# Patient Record
Sex: Female | Born: 1975 | Race: Black or African American | Hispanic: No | Marital: Married | Smoking: Never smoker
Health system: Southern US, Community
[De-identification: ages and names within clinical notes are randomized; demographics above are authoritative.]

## PROBLEM LIST (undated history)

## (undated) DIAGNOSIS — H10409 Unspecified chronic conjunctivitis, unspecified eye: Secondary | ICD-10-CM

## (undated) DIAGNOSIS — Z9109 Other allergy status, other than to drugs and biological substances: Secondary | ICD-10-CM

## (undated) DIAGNOSIS — M1990 Unspecified osteoarthritis, unspecified site: Secondary | ICD-10-CM

## (undated) DIAGNOSIS — Z973 Presence of spectacles and contact lenses: Secondary | ICD-10-CM

## (undated) DIAGNOSIS — H409 Unspecified glaucoma: Secondary | ICD-10-CM

## (undated) DIAGNOSIS — J309 Allergic rhinitis, unspecified: Secondary | ICD-10-CM

## (undated) DIAGNOSIS — N939 Abnormal uterine and vaginal bleeding, unspecified: Secondary | ICD-10-CM

## (undated) DIAGNOSIS — D509 Iron deficiency anemia, unspecified: Secondary | ICD-10-CM

## (undated) DIAGNOSIS — Z8632 Personal history of gestational diabetes: Secondary | ICD-10-CM

## (undated) DIAGNOSIS — D259 Leiomyoma of uterus, unspecified: Secondary | ICD-10-CM

## (undated) HISTORY — PX: NO PAST SURGERIES: SHX2092

---

## 2005-12-03 ENCOUNTER — Ambulatory Visit: Payer: Self-pay | Admitting: Family Medicine

## 2006-02-11 ENCOUNTER — Inpatient Hospital Stay (HOSPITAL_COMMUNITY): Admission: AD | Admit: 2006-02-11 | Discharge: 2006-02-11 | Payer: Self-pay | Admitting: Gynecology

## 2006-02-25 ENCOUNTER — Ambulatory Visit (HOSPITAL_COMMUNITY): Admission: RE | Admit: 2006-02-25 | Discharge: 2006-02-25 | Payer: Self-pay | Admitting: Gynecology

## 2006-04-17 ENCOUNTER — Ambulatory Visit: Payer: Self-pay | Admitting: Obstetrics & Gynecology

## 2006-05-01 ENCOUNTER — Ambulatory Visit (HOSPITAL_COMMUNITY): Admission: RE | Admit: 2006-05-01 | Discharge: 2006-05-01 | Payer: Self-pay | Admitting: Family Medicine

## 2006-05-01 ENCOUNTER — Ambulatory Visit: Payer: Self-pay | Admitting: Obstetrics & Gynecology

## 2006-05-15 ENCOUNTER — Ambulatory Visit: Payer: Self-pay | Admitting: *Deleted

## 2006-05-19 ENCOUNTER — Inpatient Hospital Stay (HOSPITAL_COMMUNITY): Admission: RE | Admit: 2006-05-19 | Discharge: 2006-05-19 | Payer: Self-pay | Admitting: Family Medicine

## 2006-05-20 ENCOUNTER — Ambulatory Visit: Payer: Self-pay | Admitting: Family Medicine

## 2006-05-27 ENCOUNTER — Ambulatory Visit: Payer: Self-pay | Admitting: Family Medicine

## 2006-06-03 ENCOUNTER — Ambulatory Visit: Payer: Self-pay | Admitting: Obstetrics & Gynecology

## 2006-06-17 ENCOUNTER — Ambulatory Visit: Payer: Self-pay | Admitting: Family Medicine

## 2006-07-01 ENCOUNTER — Ambulatory Visit: Payer: Self-pay | Admitting: Family Medicine

## 2006-07-08 ENCOUNTER — Ambulatory Visit: Payer: Self-pay | Admitting: Obstetrics & Gynecology

## 2006-07-11 ENCOUNTER — Ambulatory Visit: Payer: Self-pay | Admitting: Family Medicine

## 2006-07-15 ENCOUNTER — Ambulatory Visit: Payer: Self-pay | Admitting: Obstetrics & Gynecology

## 2006-07-15 ENCOUNTER — Ambulatory Visit (HOSPITAL_COMMUNITY): Admission: RE | Admit: 2006-07-15 | Discharge: 2006-07-15 | Payer: Self-pay | Admitting: Obstetrics and Gynecology

## 2006-07-22 ENCOUNTER — Ambulatory Visit: Payer: Self-pay | Admitting: Gynecology

## 2006-07-23 ENCOUNTER — Inpatient Hospital Stay (HOSPITAL_COMMUNITY): Admission: RE | Admit: 2006-07-23 | Discharge: 2006-07-26 | Payer: Self-pay | Admitting: Gynecology

## 2006-07-23 ENCOUNTER — Ambulatory Visit: Payer: Self-pay | Admitting: Obstetrics and Gynecology

## 2006-08-02 ENCOUNTER — Ambulatory Visit: Payer: Self-pay | Admitting: *Deleted

## 2006-08-02 ENCOUNTER — Inpatient Hospital Stay (HOSPITAL_COMMUNITY): Admission: AD | Admit: 2006-08-02 | Discharge: 2006-08-02 | Payer: Self-pay | Admitting: Obstetrics & Gynecology

## 2007-12-19 ENCOUNTER — Ambulatory Visit: Payer: Self-pay | Admitting: Family Medicine

## 2007-12-22 ENCOUNTER — Ambulatory Visit: Payer: Self-pay | Admitting: *Deleted

## 2007-12-25 ENCOUNTER — Ambulatory Visit: Payer: Self-pay | Admitting: Internal Medicine

## 2008-04-02 ENCOUNTER — Ambulatory Visit: Payer: Self-pay | Admitting: Internal Medicine

## 2008-05-03 ENCOUNTER — Inpatient Hospital Stay (HOSPITAL_COMMUNITY): Admission: AD | Admit: 2008-05-03 | Discharge: 2008-05-04 | Payer: Self-pay | Admitting: Obstetrics & Gynecology

## 2008-05-19 ENCOUNTER — Inpatient Hospital Stay (HOSPITAL_COMMUNITY): Admission: AD | Admit: 2008-05-19 | Discharge: 2008-05-19 | Payer: Self-pay | Admitting: Obstetrics & Gynecology

## 2008-05-23 ENCOUNTER — Inpatient Hospital Stay (HOSPITAL_COMMUNITY): Admission: AD | Admit: 2008-05-23 | Discharge: 2008-05-23 | Payer: Self-pay | Admitting: Obstetrics & Gynecology

## 2008-10-30 ENCOUNTER — Inpatient Hospital Stay (HOSPITAL_COMMUNITY): Admission: AD | Admit: 2008-10-30 | Discharge: 2008-10-30 | Payer: Self-pay | Admitting: Obstetrics and Gynecology

## 2008-11-24 ENCOUNTER — Inpatient Hospital Stay (HOSPITAL_COMMUNITY): Admission: AD | Admit: 2008-11-24 | Discharge: 2008-11-26 | Payer: Self-pay | Admitting: Obstetrics and Gynecology

## 2009-06-06 ENCOUNTER — Ambulatory Visit: Payer: Self-pay | Admitting: Internal Medicine

## 2009-12-19 ENCOUNTER — Emergency Department (HOSPITAL_COMMUNITY): Admission: EM | Admit: 2009-12-19 | Discharge: 2009-12-19 | Payer: Self-pay | Admitting: Emergency Medicine

## 2010-08-27 DIAGNOSIS — Z8632 Personal history of gestational diabetes: Secondary | ICD-10-CM

## 2010-08-27 HISTORY — DX: Personal history of gestational diabetes: Z86.32

## 2010-12-06 LAB — CBC
HCT: 31.5 % — ABNORMAL LOW (ref 36.0–46.0)
Hemoglobin: 10.7 g/dL — ABNORMAL LOW (ref 12.0–15.0)
Platelets: 143 10*3/uL — ABNORMAL LOW (ref 150–400)
RBC: 3.44 MIL/uL — ABNORMAL LOW (ref 3.87–5.11)
RDW: 14.4 % (ref 11.5–15.5)

## 2010-12-07 LAB — CBC: MCHC: 33.8 g/dL (ref 30.0–36.0)

## 2011-01-09 NOTE — Discharge Summary (Signed)
Renee Roy, Renee Roy                  ACCOUNT NO.:  [id number]   MEDICAL RECORD NO.:  [id number]          PATIENT TYPE:  INP   LOCATION:  9104                          FACILITY:  [facility]   PHYSICIAN:  Sherron Monday, MD        DATE OF BIRTH:  [birth year]   DATE OF ADMISSION:  11/24/2008  DATE OF DISCHARGE:  11/26/2008                               DISCHARGE SUMMARY   ADMITTING DIAGNOSIS:  Intrauterine pregnancy at term in early labor,  delivered via spontaneous vaginal delivery.   HISTORY OF PRESENT ILLNESS:  A 35 year old G2, P38 with an EDC of November 27, 2008 admitted in early labor.  She came in for evaluation of  contractions and was then to be 3-4 cm dilated, 80% effaced, and was  admitted.   PAST MEDICAL HISTORY:  Not significant.   PAST SURGICAL HISTORY:  Not significant.   PAST OB/GYN HISTORY:  G1 was a vaginal delivery of a female infant at  term.  She has a history of gestational diabetes.  No abnormal Paps or  sexually transmitted diseases.   MEDICATIONS:  Prenatal vitamins.   ALLERGIES:  No known drug allergies.   SOCIAL HISTORY:  Denies alcohol, tobacco, or drug use.   FAMILY HISTORY:  Negative for the patient.   PRENATAL LABORATORY DATA:  O positive.  Antibody screen negative.  Sickle cell within normal limits.  RPR nonreactive, rubella immune,  hepatitis B surface antigen negative, and HIV negative.  Gonorrhea  negative, chlamydia negative, 1-hour Glucola of 150, 2-hour Glucola of  100, 166, 148, 89, which was within normal limits.  She declined a quad  scan.  She had an ultrasound on August 11, 2008 at 15 plus 2 weeks  confirming the [facility] of November 27, 2008 and ultrasound was performed.  At 19  weeks from the ED revealed pyelectasis.  Repeat ultrasound showed no  pyelectasis, however.  Fluid collection on the anterior wall.  Her  prenatal course was otherwise uncomplicated.   Admitted physical exam, afebrile.  Vital signs stable and benign exam  with fetal heart tones  are reactive without decelerations.  She was  admitted and progressed in labor.  We used Stadol for anesthesia.  She  progressed complete, complete +2, pushed to deliver a viable female infant  with Apgars of 9 and 9 and a weight of 6 pounds 12 ounces with second-  degree laceration.  Postpartum course was relatively uncomplicated.  She  remained afebrile.  Vital signs stable with a hemoglobin decreasing from  12.1-10.7.  She was discharged home on postpartum day #2.  At this time,  she was feeling well, voiding, and tolerating a diet.  Normal lochia and  her pain was well controlled.  She plans to breast feed.  We will  discuss contraception at her postpartum checkup.  Hemoglobin decreased  from 12.1-10.7.  She  is rubella immune and O positive.  She was given routine discharge  instructions and numbers to call if any questions or problems.  She  voices understanding of this and was discharged to home.  She was  discharged with prescriptions for Motrin, Vicodin, and prenatal  vitamins.      Sherron Monday, MD  Electronically Signed     JB/MEDQ  D:  11/26/2008  T:  11/26/2008  Job:  [id number]

## 2011-05-28 LAB — MONONUCLEOSIS SCREEN: Mono Screen: NEGATIVE

## 2011-05-28 LAB — URINALYSIS, ROUTINE W REFLEX MICROSCOPIC
Bilirubin Urine: NEGATIVE
Ketones, ur: NEGATIVE
Leukocytes, UA: NEGATIVE
Protein, ur: NEGATIVE
Specific Gravity, Urine: >1.03 — ABNORMAL HIGH
Urobilinogen, UA: 0.2
pH: 6

## 2011-05-28 LAB — CBC
Hemoglobin: 10.8 — ABNORMAL LOW
Hemoglobin: 11 — ABNORMAL LOW
MCHC: 33.2
MCHC: 33.6
MCV: 84.1
Platelets: 222
RBC: 3.86 — ABNORMAL LOW
RBC: 3.96
RDW: 20.2 — ABNORMAL HIGH

## 2011-05-28 LAB — DIFFERENTIAL
Basophils Absolute: 0
Basophils Relative: 0
Lymphs Abs: 1.3
Monocytes Relative: 10
Neutrophils Relative %: 69

## 2011-05-28 LAB — WET PREP, GENITAL

## 2011-05-28 LAB — GC/CHLAMYDIA PROBE AMP, GENITAL: Chlamydia, DNA Probe: NEGATIVE

## 2011-05-30 LAB — URINALYSIS, ROUTINE W REFLEX MICROSCOPIC
Ketones, ur: NEGATIVE
Specific Gravity, Urine: 1.015
pH: 8.5 — ABNORMAL HIGH

## 2011-05-30 LAB — POCT PREGNANCY, URINE: Preg Test, Ur: POSITIVE

## 2012-07-09 ENCOUNTER — Encounter (HOSPITAL_COMMUNITY): Payer: Self-pay

## 2012-07-09 ENCOUNTER — Emergency Department (HOSPITAL_COMMUNITY)
Admission: EM | Admit: 2012-07-09 | Discharge: 2012-07-09 | Disposition: A | Discharge status: Home / Self Care | Payer: Medicaid Other | Source: Home / Self Care | Attending: Family Medicine | Admitting: Family Medicine

## 2012-07-09 DIAGNOSIS — N970 Female infertility associated with anovulation: Secondary | ICD-10-CM

## 2012-07-09 DIAGNOSIS — R9389 Abnormal findings on diagnostic imaging of other specified body structures: Secondary | ICD-10-CM

## 2012-07-09 LAB — POCT URINALYSIS DIP (DEVICE)
Bilirubin Urine: NEGATIVE
Glucose, UA: NEGATIVE mg/dL
Ketones, ur: NEGATIVE mg/dL
Specific Gravity, Urine: 1.015 (ref 1.005–1.030)
pH: 7 (ref 5.0–8.0)

## 2012-07-09 LAB — POCT I-STAT, CHEM 8
Hemoglobin: 13.3 g/dL (ref 12.0–15.0)
Sodium: 143 mEq/L (ref 135–145)
TCO2: 24 mmol/L (ref 0–100)

## 2012-07-09 MED ORDER — NAPROXEN 500 MG PO TABS: 500.0000 mg | ORAL_TABLET | Freq: Two times a day (BID) | ORAL | Status: DC

## 2012-07-09 NOTE — ED Notes (Signed)
Pt states that she has had abdominal pain (denies pain today) she states she has been bleeding for 6 mos and her baby is now 72 mos old, she does have a Implanon that was placed in May 2013 in [place]

## 2012-07-10 LAB — GC/CHLAMYDIA PROBE AMP: GC Probe RNA: NEGATIVE

## 2012-07-11 NOTE — ED Provider Notes (Signed)
History     CSN: [id number]  Arrival date & time 07/09/12  4098   First MD Initiated Contact with Patient 07/09/12 1015      Chief Complaint  Patient presents with  . Menstrual Problem    (Consider location/radiation/quality/duration/timing/severity/associated sxs/prior treatment) HPI Comments: 36 y/o female reports being 6 months postpartum (SVD) had a Implanon placed in [place] on 12/2011. Patient reports experiencing minimal spotting almost daily for last 5 months, reports intermittent pelvic cramps and has seen an intermittent vaginal discharge with no other symptoms. denies current pelvic or abdominal pain, denies prior h/o STD's. Patient is breast feeding. Denies dizziness or headaches. No other symptoms. No h/o bruising, mucosal bleeding or blood disorders.   History reviewed. No pertinent past medical history.  History reviewed. No pertinent past surgical history.  No family history on file.  History  Substance Use Topics  . Smoking status: Never Smoker   . Smokeless tobacco: Not on file  . Alcohol Use: No    OB History    Grav Para Term Preterm Abortions TAB SAB Ect Mult Living                  Review of Systems  Constitutional: Negative for fever, chills, activity change and appetite change.  Gastrointestinal: Negative for nausea, vomiting, abdominal pain and diarrhea.  Genitourinary: Positive for vaginal bleeding, vaginal discharge and menstrual problem. Negative for dysuria, urgency, frequency, flank pain, vaginal pain and pelvic pain.  Skin: Negative for rash.  Hematological: Does not bruise/bleed easily.  All other systems reviewed and are negative.    Allergies  Review of patient's allergies indicates no known allergies.  Home Medications   Current Outpatient Rx  Name  Route  Sig  Dispense  Refill  . NAPROXEN 500 MG PO TABS   Oral   Take 1 tablet (500 mg total) by mouth 2 (two) times daily with a meal.   30 tablet   0     BP 115/80  Pulse 76   Temp 97.9 F (36.6 C) (Oral)  Resp 16  SpO2 100%  Physical Exam  Nursing note and vitals reviewed. Constitutional: She is oriented to person, place, and time. She appears well-developed and well-nourished. No distress.  HENT:  Head: Normocephalic and atraumatic.  Mouth/Throat: Oropharynx is clear and moist. No oropharyngeal exudate.  Eyes: Pupils are equal, round, and reactive to light. No scleral icterus.       Pink conjunctivas  Neck: Neck supple. No thyromegaly present.  Cardiovascular: Normal rate, regular rhythm and normal heart sounds.   Pulmonary/Chest: Effort normal and breath sounds normal.  Abdominal: Soft. She exhibits no distension and no mass. There is no tenderness.       No hepatosplenomegaly.  Genitourinary: Uterus normal. Uterus is not enlarged and not tender. Cervix exhibits no motion tenderness and no friability. Right adnexum displays no mass, no tenderness and no fullness. Left adnexum displays no mass, no tenderness and no fullness. No erythema around the vagina. Vaginal discharge found.    Lymphadenopathy:    She has no cervical adenopathy.  Neurological: She is alert and oriented to person, place, and time.  Skin: No rash noted. She is not diaphoretic.    ED Course  Procedures (including critical care time)  Labs Reviewed  POCT URINALYSIS DIP (DEVICE) - Abnormal; Notable for the following:    Hgb urine dipstick LARGE (*)     All other components within normal limits  POCT I-STAT, CHEM 8 - Abnormal; Notable  for the following:    Calcium, Ion 1.28 (*)     All other components within normal limits  POCT PREGNANCY, URINE  GC/CHLAMYDIA PROBE AMP   No results found.   1. Anovulatory (dysfunctional uterine) bleeding       MDM  Reccommended a cycle with OCP. Patient not interested as she is breast feeding. I do not think that progestin only pills will help as implanon is progestin only. Likely experiencing breakthrough bleeding due to endometrial  atrophy. reccommended GYN follow up. poc hemoglobin normal. Reccommended to continue prenatal vitamins, naprosyn for cramps as needed. GC/CHl pending at time of discharge.   Sharin Grave, MD 07/11/12 [phone]

## 2012-07-31 ENCOUNTER — Emergency Department (HOSPITAL_COMMUNITY)
Admission: EM | Admit: 2012-07-31 | Discharge: 2012-07-31 | Disposition: A | Discharge status: Home / Self Care | Payer: Medicaid Other | Source: Home / Self Care | Attending: Family Medicine | Admitting: Family Medicine

## 2012-07-31 ENCOUNTER — Encounter (HOSPITAL_COMMUNITY): Payer: Self-pay | Admitting: Emergency Medicine

## 2012-07-31 DIAGNOSIS — R9389 Abnormal findings on diagnostic imaging of other specified body structures: Secondary | ICD-10-CM

## 2012-07-31 DIAGNOSIS — R93429 Abnormal radiologic findings on diagnostic imaging of unspecified kidney: Secondary | ICD-10-CM

## 2012-07-31 NOTE — ED Provider Notes (Signed)
History     CSN: [id number]  Arrival date & time 07/31/12  1253   First MD Initiated Contact with Patient 07/31/12 1338      No chief complaint on file.   (Consider location/radiation/quality/duration/timing/severity/associated sxs/prior treatment) HPI Comments: 36 year old female with no significant past medical history. 8 month postpartum. She is an Clinical cytogeneticist at [employer]. Patient came today concerned of an abnormal ultrasound finding in her right kidney detected when a classmate was performing practice renal ultrasound on her today. She states she is asymptomatic. Specifically she denies back or abdominal pain, also denies leg swelling or face swelling. She has intermittent menstrual spotting after her Implanon and she had a recent urinalysis with blood but otherwise normal creatinine last month was 0.8. No headaches or problems with blood pressure. No family history of congenital kidney disease.  No nausea or vomiting.    History reviewed. No pertinent past medical history.  History reviewed. No pertinent past surgical history.  No family history on file.  History  Substance Use Topics  . Smoking status: Never Smoker   . Smokeless tobacco: Not on file  . Alcohol Use: No    OB History    Grav Para Term Preterm Abortions TAB SAB Ect Mult Living                  Review of Systems  Constitutional: Negative for fever, chills, diaphoresis, activity change, appetite change and fatigue.  HENT: Negative for facial swelling.   Respiratory: Negative for shortness of breath.   Cardiovascular: Negative for chest pain, palpitations and leg swelling.  Gastrointestinal: Negative for nausea, vomiting, abdominal pain, diarrhea and constipation.  Genitourinary: Negative for dysuria, urgency, frequency, flank pain, decreased urine volume and pelvic pain.  Skin: Negative for rash.  Neurological: Negative for dizziness and headaches.  All other systems  reviewed and are negative.    Allergies  Review of patient's allergies indicates no known allergies.  Home Medications   Current Outpatient Rx  Name  Route  Sig  Dispense  Refill  . ONE-DAILY MULTI VITAMINS PO TABS   Oral   Take 1 tablet by mouth daily.         Marland Kitchen NAPROXEN 500 MG PO TABS   Oral   Take 1 tablet (500 mg total) by mouth 2 (two) times daily with a meal.   30 tablet   0     BP 111/76  Pulse 89  Temp 98.2 F (36.8 C) (Oral)  Resp 16  SpO2 99%  LMP 07/31/2012  Physical Exam  Nursing note and vitals reviewed. Constitutional: She is oriented to person, place, and time. She appears well-developed and well-nourished. No distress.  HENT:  Head: Normocephalic and atraumatic.  Mouth/Throat: Oropharynx is clear and moist.  Neck: Neck supple. No JVD present. No thyromegaly present.  Cardiovascular: Normal rate, regular rhythm, normal heart sounds and intact distal pulses.   No murmur heard. Pulmonary/Chest: Effort normal and breath sounds normal. No respiratory distress. She has no wheezes. She has no rales. She exhibits no tenderness.  Abdominal: Soft. She exhibits no distension and no mass. There is no tenderness. There is no rebound and no guarding.       No CVT bilaterally.  Lymphadenopathy:    She has no cervical adenopathy.  Neurological: She is alert and oriented to person, place, and time.  Skin: No rash noted. She is not diaphoretic.    ED Course  Procedures (including critical care time)  Labs Reviewed - No data to display No results found.   1. Abnormal ultrasound of kidney       MDM  36 year old female 8 months postpartum with an abnormal practice ultrasound by a non certified ultrasound Engineering geologist. Normal creatinine less than one month ago. I placed an outpatient order for a renal ultrasound for followup. Reassured patient about incidental findings in asymptomatic patients. No treatments prescribed today. Was given adult clinic number to  call for followup appointment. We'll contact patient if abnormal ultrasound results.        Sharin Grave, MD 08/01/12 [phone]

## 2012-07-31 NOTE — ED Notes (Signed)
Patient has conflict with appt provided.  Gave patient ultrasound scheduling number [id number].

## 2012-07-31 NOTE — ED Notes (Signed)
Reported u/s appt to dr Alfonse Ras, accepted date and time.  U/s at  12/10 @10 :45 a.m. First floor Radiology

## 2012-07-31 NOTE — ED Notes (Signed)
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  Reports the class was doing u/s on each other.  Class noted abnormality.  Teacher told her to follow up with her pcp.  No pcp.  Has medicaid--but difficulty finding a physician to evaluate this random u/s.  Denies any pain

## 2012-07-31 NOTE — ED Notes (Signed)
Discharge pending u/s arrangements

## 2012-07-31 NOTE — ED Notes (Signed)
Went to obtain urine specimen and attending nurse stated that she wanted to confer with attending physician before sending patient for specimen

## 2012-08-05 ENCOUNTER — Ambulatory Visit (HOSPITAL_COMMUNITY): Payer: Medicaid Other

## 2012-08-07 ENCOUNTER — Ambulatory Visit (HOSPITAL_COMMUNITY): Admission: RE | Admit: 2012-08-07 | Payer: Medicaid Other | Source: Ambulatory Visit

## 2012-08-11 ENCOUNTER — Other Ambulatory Visit (HOSPITAL_COMMUNITY): Payer: Medicaid Other

## 2012-08-12 ENCOUNTER — Ambulatory Visit (HOSPITAL_COMMUNITY): Payer: Medicaid Other

## 2012-10-11 ENCOUNTER — Inpatient Hospital Stay (HOSPITAL_COMMUNITY)
Admission: AD | Admit: 2012-10-11 | Discharge: 2012-10-11 | Disposition: A | Discharge status: Home / Self Care | Payer: Medicaid Other | Source: Ambulatory Visit | Attending: Obstetrics & Gynecology | Admitting: Obstetrics & Gynecology

## 2012-10-11 ENCOUNTER — Encounter (HOSPITAL_COMMUNITY): Payer: Self-pay | Admitting: *Deleted

## 2012-10-11 DIAGNOSIS — N938 Other specified abnormal uterine and vaginal bleeding: Secondary | ICD-10-CM | POA: Insufficient documentation

## 2012-10-11 DIAGNOSIS — N949 Unspecified condition associated with female genital organs and menstrual cycle: Secondary | ICD-10-CM | POA: Insufficient documentation

## 2012-10-11 DIAGNOSIS — N926 Irregular menstruation, unspecified: Secondary | ICD-10-CM

## 2012-10-11 LAB — WET PREP, GENITAL
Trich, Wet Prep: NONE SEEN
Yeast Wet Prep HPF POC: NONE SEEN

## 2012-10-11 LAB — POCT PREGNANCY, URINE: Preg Test, Ur: NEGATIVE

## 2012-10-11 LAB — CBC
MCHC: 33.7 g/dL (ref 30.0–36.0)
RDW: 13.4 % (ref 11.5–15.5)

## 2012-10-11 LAB — URINALYSIS, ROUTINE W REFLEX MICROSCOPIC
Leukocytes, UA: NEGATIVE
Nitrite: NEGATIVE
Protein, ur: NEGATIVE mg/dL
Urobilinogen, UA: 0.2 mg/dL (ref 0.0–1.0)

## 2012-10-11 LAB — URINE MICROSCOPIC-ADD ON: WBC, UA: NONE SEEN WBC/hpf (ref <3)

## 2012-10-11 MED ORDER — NORGESTIMATE-ETH ESTRADIOL 0.25-35 MG-MCG PO TABS: 1.0000 | ORAL_TABLET | Freq: Every day | ORAL | Status: DC

## 2012-10-11 NOTE — MAU Note (Signed)
Pt states she has been having bleeding on and off since her implant was placed after her last delivery in 2013. States some mild abdominal cramping.

## 2012-10-11 NOTE — MAU Provider Note (Signed)
History     CSN: [id number]  Arrival date and time: 10/11/12 1354   None     Chief Complaint  Patient presents with  . Vaginal Bleeding   HPI Ms. Renee Roy is a 37 y.o. [phone] who presents to MAU today with complaint of irregular vaginal bleeding. The patient states that she has been bleeding since she had the implant birth control placed about 9 months ago following the birth of her last child. The patient had been breastfeeding as well, until about 1 month ago. She states that she has had some bleeding everyday since the implant was placed and that she has heavier bleeding 10 days to 2 weeks each month that is more like a normal period. She had the implant placed in [place]. She denies feeling tired, lightheaded or dizzy. She has mild lower abdominal cramping when the bleeding is heavier. She denies N/V or fever.   OB History   Grav Para Term Preterm Abortions TAB SAB Ect Mult Living   3 3 3  0 0 0 0 0 0 3      History reviewed. No pertinent past medical history.  History reviewed. No pertinent past surgical history.  History reviewed. No pertinent family history.  History  Substance Use Topics  . Smoking status: Never Smoker   . Smokeless tobacco: Not on file  . Alcohol Use: No    Allergies: No Known Allergies  Prescriptions prior to admission  Medication Sig Dispense Refill  . cetirizine (ZYRTEC) 10 MG tablet Take 10 mg by mouth daily as needed for allergies.        Review of Systems  Constitutional: Negative for fever, chills and malaise/fatigue.  Gastrointestinal: Positive for abdominal pain. Negative for nausea and vomiting.  Genitourinary: Negative for dysuria, urgency and frequency.       + vaginal bleeding Neg abnormal discharge  Neurological: Negative for dizziness and weakness.   Physical Exam   Blood pressure 120/73, pulse 96, temperature 97.6 F (36.4 C), temperature source Oral, resp. rate 18, last menstrual period 10/11/2012, unknown if currently  breastfeeding.  Physical Exam  Constitutional: She is oriented to person, place, and time. She appears well-developed and well-nourished. No distress.  HENT:  Head: Normocephalic and atraumatic.  Mouth/Throat: Mucous membranes are not pale and not dry.  Cardiovascular: Normal rate, regular rhythm and normal heart sounds.   Respiratory: Effort normal and breath sounds normal. No respiratory distress.  GI: Soft. Bowel sounds are normal. She exhibits no distension and no mass. There is no tenderness. There is no rebound and no guarding.  Genitourinary: Vagina normal. Uterus is not enlarged and not tender. Cervix exhibits discharge (small amount of blood noted at the cervical os and in the vaginal vault). Cervix exhibits no motion tenderness and no friability. Right adnexum displays no mass and no tenderness. Left adnexum displays no mass and no tenderness.  Neurological: She is alert and oriented to person, place, and time.  Skin: Skin is warm and dry. No erythema.  Psychiatric: She has a normal mood and affect.   Results for orders placed during the hospital encounter of 10/11/12 (from the past 24 hour(s))  URINALYSIS, ROUTINE W REFLEX MICROSCOPIC     Status: Abnormal   Collection Time    10/11/12  2:00 PM      Result Value Range   Color, Urine YELLOW  YELLOW   APPearance CLEAR  CLEAR   Specific Gravity, Urine 1.025  1.005 - 1.030   pH 6.0  5.0 -  8.0   Glucose, UA NEGATIVE  NEGATIVE mg/dL   Hgb urine dipstick LARGE (*) NEGATIVE   Bilirubin Urine NEGATIVE  NEGATIVE   Ketones, ur NEGATIVE  NEGATIVE mg/dL   Protein, ur NEGATIVE  NEGATIVE mg/dL   Urobilinogen, UA 0.2  0.0 - 1.0 mg/dL   Nitrite NEGATIVE  NEGATIVE   Leukocytes, UA NEGATIVE  NEGATIVE  URINE MICROSCOPIC-ADD ON     Status: Abnormal   Collection Time    10/11/12  2:00 PM      Result Value Range   Squamous Epithelial / LPF FEW (*) RARE   WBC, UA    <3 WBC/hpf   Value: NO FORMED ELEMENTS SEEN ON URINE MICROSCOPIC EXAMINATION    RBC / HPF 21-50  <3 RBC/hpf   Bacteria, UA RARE  RARE  POCT PREGNANCY, URINE     Status: None   Collection Time    10/11/12  2:14 PM      Result Value Range   Preg Test, Ur NEGATIVE  NEGATIVE  WET PREP, GENITAL     Status: Abnormal   Collection Time    10/11/12  2:52 PM      Result Value Range   Yeast Wet Prep HPF POC NONE SEEN  NONE SEEN   Trich, Wet Prep NONE SEEN  NONE SEEN   Clue Cells Wet Prep HPF POC NONE SEEN  NONE SEEN   WBC, Wet Prep HPF POC FEW (*) NONE SEEN  CBC     Status: None   Collection Time    10/11/12  3:00 PM      Result Value Range   WBC 5.4  4.0 - 10.5 K/uL   RBC 4.26  3.87 - 5.11 MIL/uL   Hemoglobin 12.5  12.0 - 15.0 g/dL   HCT 16.1  09.6 - 04.5 %   MCV 87.1  78.0 - 100.0 fL   MCH 29.3  26.0 - 34.0 pg   MCHC 33.7  30.0 - 36.0 g/dL   RDW 40.9  81.1 - 91.4 %   Platelets 203  150 - 400 K/uL    MAU Course  Procedures None  MDM Hemodynamically stable.   Assessment and Plan  A: Abnormal uterine bleeding secondary to implant  P: Discharge home Rx for Sprintec with 2 refills sent to patient's pharmacy Referral sent to [facility] clinic for follow-up in 3-4 weeks Discussed other options for birth control to be decided at follow-up visit in the clinic Patient may return to MAU as needed  Freddi Starr, PA-C  10/11/2012, 3:32 PM

## 2012-11-06 ENCOUNTER — Encounter: Payer: Medicaid Other | Admitting: Advanced Practice Midwife

## 2012-11-10 ENCOUNTER — Encounter: Payer: Medicaid Other | Admitting: Obstetrics and Gynecology

## 2012-11-21 ENCOUNTER — Emergency Department (HOSPITAL_COMMUNITY)
Admission: EM | Admit: 2012-11-21 | Discharge: 2012-11-21 | Disposition: A | Discharge status: Home / Self Care | Payer: Medicaid Other | Attending: Emergency Medicine | Admitting: Emergency Medicine

## 2012-11-21 DIAGNOSIS — M542 Cervicalgia: Secondary | ICD-10-CM | POA: Insufficient documentation

## 2012-11-21 DIAGNOSIS — M79609 Pain in unspecified limb: Secondary | ICD-10-CM | POA: Insufficient documentation

## 2012-11-21 DIAGNOSIS — M79602 Pain in left arm: Secondary | ICD-10-CM

## 2012-11-21 MED ORDER — TRAMADOL HCL 50 MG PO TABS: 50.0000 mg | ORAL_TABLET | Freq: Two times a day (BID) | ORAL | Status: DC

## 2012-11-21 MED ORDER — TRAMADOL HCL 50 MG PO TABS
50.0000 mg | ORAL_TABLET | Freq: Once | ORAL | Status: AC
  Administered 2012-11-21: 50 mg via ORAL
  Filled 2012-11-21: qty 1

## 2012-11-21 NOTE — ED Provider Notes (Signed)
History    This chart was scribed for non-physician practitioner working with Raeford Razor, MD by Smitty Pluck, ED scribe. This patient was seen in room WTR5/WTR5 and the patient's care was started at 8:14 PM.   CSN: [id number]  Arrival date & time 11/21/12  1907      Chief Complaint  Patient presents with  . Arm Pain     The history is provided by the patient. No language interpreter was used.   Renee Roy is a 37 y.o. female who presents to the Emergency Department complaining of constant, moderate left arm pain with gradual onset 5 days ago. Pt reports that she thinks that she has swelling in her left arm. She mentions having associated neck tightness. She states she has birth control implant in left arm that was placed 1 year ago. She states she did not have left arm pain until 5 days ago. She mentions having intermittent left arm weakness. Certain movements of left arm aggravate the pain. She reports hx of similar symptoms that subsided without treatment. Pt denies trauma to left arm, fall, chest pain, back pain, fever, chills, nausea, vomiting, diarrhea, numbness in extremities, cough, SOB and any other pain.  Pt does not have PCP.    No past medical history on file.  No past surgical history on file.  No family history on file.  History  Substance Use Topics  . Smoking status: Never Smoker   . Smokeless tobacco: Not on file  . Alcohol Use: No    OB History   Grav Para Term Preterm Abortions TAB SAB Ect Mult Living   3 3 3  0 0 0 0 0 0 3      Review of Systems  Constitutional: Negative for fever and chills.  HENT: Positive for neck pain.   Respiratory: Negative for shortness of breath.   Gastrointestinal: Negative for nausea and vomiting.  Neurological: Negative for weakness and numbness.  All other systems reviewed and are negative.    Allergies  Review of patient's allergies indicates no known allergies.  Home Medications   Current Outpatient Rx  Name   Route  Sig  Dispense  Refill  . cetirizine (ZYRTEC) 10 MG tablet   Oral   Take 10 mg by mouth daily as needed for allergies.         Marland Kitchen traMADol (ULTRAM) 50 MG tablet   Oral   Take 1 tablet (50 mg total) by mouth 2 (two) times daily.   20 tablet   0     BP 106/71  Pulse 98  Temp(Src) 98.4 F (36.9 C) (Oral)  SpO2 100%  Physical Exam  Nursing note and vitals reviewed. Constitutional: She is oriented to person, place, and time. She appears well-developed and well-nourished. No distress.  HENT:  Head: Normocephalic and atraumatic.  Eyes: EOM are normal.  Neck: Neck supple. No tracheal deviation present.  Cardiovascular: Normal rate.   Pulmonary/Chest: Effort normal. No respiratory distress.  Musculoskeletal: Normal range of motion.       Left shoulder: She exhibits normal range of motion, no tenderness, no bony tenderness, no swelling, no effusion, no crepitus, no deformity, no laceration, no pain, no spasm, normal pulse and normal strength.       Left upper arm: Normal.       Left forearm: Normal.       Left hand: Normal.  No abnormalities noted during physical exam  Pt has palpable contraceptive implant to medial upper arm that is not  tender or indurated.  Neurological: She is alert and oriented to person, place, and time.  Skin: Skin is warm and dry.  Psychiatric: She has a normal mood and affect. Her behavior is normal.    ED Course  Procedures (including critical care time) DIAGNOSTIC STUDIES: Oxygen Saturation is 100% on room air, normal by my interpretation.    COORDINATION OF CARE: 8:18 PM Discussed ED treatment with pt and pt agrees.  Ultram Rx and referral to Ortho    Labs Reviewed - No data to display No results found.   1. Arm pain, diffuse, left       MDM  Pt has been advised of the symptoms that warrant their return to the ED. Patient has voiced understanding and has agreed to follow-up with the PCP or specialist.  I personally performed the  services described in this documentation, which was scribed in my presence. The recorded information has been reviewed and is accurate.    Dorthula Matas, PA-C 11/21/12 2211

## 2012-11-21 NOTE — ED Notes (Signed)
Pt c/o L arm pain for the last 5 days. States pain comes and goes. Pt denies recent trauma to arm. Pt also states she has some pain in her neck off and on for the past month. Pain worse with positioning. Pt denies chest pain. States she drove herself here. Pt ambulatory to exam room with steady gait.

## 2012-11-23 ENCOUNTER — Emergency Department (HOSPITAL_COMMUNITY)
Admission: EM | Admit: 2012-11-23 | Discharge: 2012-11-24 | Disposition: A | Discharge status: Home / Self Care | Payer: Medicaid Other | Attending: Emergency Medicine | Admitting: Emergency Medicine

## 2012-11-23 ENCOUNTER — Encounter (HOSPITAL_COMMUNITY): Payer: Self-pay | Admitting: Emergency Medicine

## 2012-11-23 DIAGNOSIS — R112 Nausea with vomiting, unspecified: Secondary | ICD-10-CM | POA: Insufficient documentation

## 2012-11-23 DIAGNOSIS — M5412 Radiculopathy, cervical region: Secondary | ICD-10-CM

## 2012-11-23 DIAGNOSIS — Z3202 Encounter for pregnancy test, result negative: Secondary | ICD-10-CM | POA: Insufficient documentation

## 2012-11-23 DIAGNOSIS — M79609 Pain in unspecified limb: Secondary | ICD-10-CM | POA: Insufficient documentation

## 2012-11-23 LAB — POCT I-STAT, CHEM 8
BUN: 11 mg/dL (ref 6–23)
HCT: 37 % (ref 36.0–46.0)
Sodium: 141 mEq/L (ref 135–145)
TCO2: 27 mmol/L (ref 0–100)

## 2012-11-23 NOTE — ED Notes (Signed)
Pt c/o generalized body aches/pain. Pt states she was here  Friday for same, was given tramadol for pain but does not take d/t n/v after taking medication.

## 2012-11-24 LAB — URINALYSIS, ROUTINE W REFLEX MICROSCOPIC
Glucose, UA: NEGATIVE mg/dL
Leukocytes, UA: NEGATIVE
Protein, ur: NEGATIVE mg/dL
pH: 6.5 (ref 5.0–8.0)

## 2012-11-24 LAB — POCT PREGNANCY, URINE: Preg Test, Ur: NEGATIVE

## 2012-11-24 MED ORDER — NAPROXEN 500 MG PO TABS: 500.0000 mg | ORAL_TABLET | Freq: Two times a day (BID) | ORAL | Status: DC

## 2012-11-24 MED ORDER — HYDROCODONE-ACETAMINOPHEN 5-325 MG PO TABS: 1.0000 | ORAL_TABLET | ORAL | Status: DC | PRN

## 2012-11-24 NOTE — ED Provider Notes (Signed)
History     CSN: [id number]  Arrival date & time 11/23/12  2215   First MD Initiated Contact with Patient 11/23/12 2306      Chief Complaint  Patient presents with  . Generalized Body Aches   HPI  History provided by the patient and recent medical chart. Patient is a 37 year old female with no significant PMH who returns to emergency room with complaints of nausea vomiting from medication. Patient was seen and evaluated the emergency room for left upper extremity and neck pains. She was given a prescription for tramadol and began taking this but reports that it caused her nausea with one episode of vomiting. Reports it did seem to help some with pains and discomfort but she does not like taking it because of side effects. She continues to have similar symptoms of pain from her left side of the neck into her left upper extremity. Worse with some lives. She denies any history numbness.    History reviewed. No pertinent past medical history.  History reviewed. No pertinent past surgical history.  No family history on file.  History  Substance Use Topics  . Smoking status: Never Smoker   . Smokeless tobacco: Not on file  . Alcohol Use: No    OB History   Grav Para Term Preterm Abortions TAB SAB Ect Mult Living   3 3 3  0 0 0 0 0 0 3      Review of Systems  HENT: Positive for neck pain.   Respiratory: Negative for shortness of breath.   Cardiovascular: Negative for chest pain, palpitations and leg swelling.  Gastrointestinal: Positive for nausea and vomiting.  Neurological: Negative for weakness and numbness.  All other systems reviewed and are negative.    Allergies  Review of patient's allergies indicates no known allergies.  Home Medications   Current Outpatient Rx  Name  Route  Sig  Dispense  Refill  . acetaminophen (TYLENOL) 500 MG tablet   Oral   Take 500 mg by mouth every 6 (six) hours as needed for pain.         . cetirizine (ZYRTEC) 10 MG tablet    Oral   Take 10 mg by mouth daily as needed for allergies.         Marland Kitchen traMADol (ULTRAM) 50 MG tablet   Oral   Take 1 tablet (50 mg total) by mouth 2 (two) times daily.   20 tablet   0     BP 116/90  Pulse 83  Temp(Src) 97.6 F (36.4 C) (Oral)  Resp 18  Wt 136 lb (61.689 kg)  SpO2 100%  LMP 11/17/2012  Breastfeeding? No  Physical Exam  Nursing note and vitals reviewed. Constitutional: She is oriented to person, place, and time. She appears well-developed and well-nourished. No distress.  HENT:  Head: Normocephalic.  Eyes: Conjunctivae and EOM are normal. Pupils are equal, round, and reactive to light.  Neck:    Tenderness along the left trapezius and left paracervical spinous muscles. No gross deformities.  Cardiovascular: Normal rate and regular rhythm.   Pulmonary/Chest: Effort normal and breath sounds normal. No respiratory distress.  Abdominal: Soft. There is no tenderness. There is no rebound.  Musculoskeletal: Normal range of motion. She exhibits no edema.  Mild tenderness along the left deltoid and bicep area. No deformities or swelling. Skin normal. Normal strength. Full range of motion. Normal distal pulses and sensation in hand.  Neurological: She is alert and oriented to person, place, and time. She  has normal strength. No cranial nerve deficit or sensory deficit.  Skin: Skin is warm and dry. No rash noted.  Psychiatric: She has a normal mood and affect. Her behavior is normal.    ED Course  Procedures   Results for orders placed during the hospital encounter of 11/23/12  URINALYSIS, ROUTINE W REFLEX MICROSCOPIC      Result Value Range   Color, Urine YELLOW  YELLOW   APPearance CLEAR  CLEAR   Specific Gravity, Urine 1.011  1.005 - 1.030   pH 6.5  5.0 - 8.0   Glucose, UA NEGATIVE  NEGATIVE mg/dL   Hgb urine dipstick NEGATIVE  NEGATIVE   Bilirubin Urine NEGATIVE  NEGATIVE   Ketones, ur NEGATIVE  NEGATIVE mg/dL   Protein, ur NEGATIVE  NEGATIVE mg/dL    Urobilinogen, UA 0.2  0.0 - 1.0 mg/dL   Nitrite NEGATIVE  NEGATIVE   Leukocytes, UA NEGATIVE  NEGATIVE  POCT I-STAT, CHEM 8      Result Value Range   Sodium 141  135 - 145 mEq/L   Potassium 4.0  3.5 - 5.1 mEq/L   Chloride 104  96 - 112 mEq/L   BUN 11  6 - 23 mg/dL   Creatinine, Ser 1.61  0.50 - 1.10 mg/dL   Glucose, Bld 99  70 - 99 mg/dL   Calcium, Ion 0.96 (*) 1.12 - 1.23 mmol/L   TCO2 27  0 - 100 mmol/L   Hemoglobin 12.6  12.0 - 15.0 g/dL   HCT 04.5  40.9 - 81.1 %  POCT PREGNANCY, URINE      Result Value Range   Preg Test, Ur NEGATIVE  NEGATIVE        1. Upper extremity pain, left   2. Cervical radiculopathy       MDM  Patient seen and evaluated. Patient appears well in no acute distress. She is tolerating by mouth fluids. Still continues to have pain and left upper extremity slightly into the neck area. This may be radicular in nature. No chest pain, shortness of breath or heart palpitations. No significant risk factors for ACS.        Angus Seller, PA-C 11/24/12 [phone]

## 2012-11-24 NOTE — ED Provider Notes (Signed)
Medical screening examination/treatment/procedure(s) were performed by non-physician practitioner and as supervising physician I was immediately available for consultation/collaboration.  Olivia Mackie, MD 11/24/12 [phone]

## 2012-11-26 NOTE — ED Provider Notes (Signed)
Medical screening examination/treatment/procedure(s) were performed by non-physician practitioner and as supervising physician I was immediately available for consultation/collaboration.  Onyekachi Gathright, MD 11/26/12 1302 

## 2013-09-16 ENCOUNTER — Emergency Department (HOSPITAL_COMMUNITY)
Admission: EM | Admit: 2013-09-16 | Discharge: 2013-09-17 | Disposition: A | Discharge status: Home / Self Care | Payer: Medicaid Other | Attending: Emergency Medicine | Admitting: Emergency Medicine

## 2013-09-16 ENCOUNTER — Encounter (HOSPITAL_COMMUNITY): Payer: Self-pay | Admitting: Emergency Medicine

## 2013-09-16 DIAGNOSIS — Z79899 Other long term (current) drug therapy: Secondary | ICD-10-CM | POA: Insufficient documentation

## 2013-09-16 DIAGNOSIS — R52 Pain, unspecified: Secondary | ICD-10-CM | POA: Insufficient documentation

## 2013-09-16 DIAGNOSIS — R6883 Chills (without fever): Secondary | ICD-10-CM | POA: Insufficient documentation

## 2013-09-16 DIAGNOSIS — J029 Acute pharyngitis, unspecified: Secondary | ICD-10-CM | POA: Insufficient documentation

## 2013-09-16 LAB — RAPID STREP SCREEN (MED CTR MEBANE ONLY): Streptococcus, Group A Screen (Direct): NEGATIVE

## 2013-09-16 NOTE — ED Notes (Signed)
Pt ambulatory to exam room with steady gait.  

## 2013-09-16 NOTE — ED Notes (Signed)
Per pt report: pt c/o of sore throat, chills, and muscles aches that began last night.  Pt denies SOB.  Pt denies fever or cough.

## 2013-09-16 NOTE — ED Provider Notes (Signed)
CSN: [id number]     Arrival date & time 09/16/13  2054 History  This chart was scribed for Antonietta Breach, non-physician practitioner, working with No att. providers found, by Sydell Axon, ED Scribe. This patient was seen in room WTR7/WTR7 and the patient's care was started at 11:57 PM.  Chief Complaint  Patient presents with  . Sore Throat  . Generalized Body Aches   Patient is a 38 y.o. female presenting with pharyngitis. The history is provided by the patient. No language interpreter was used.  Sore Throat Pertinent negatives include no chest pain and no shortness of breath.    HPI Comments: Renee Roy is a 38 y.o. female who presents to the Emergency Department complaining of a constant, gradually worsening sore throat with onset last night. She states having associated chills and myalgias. Patient has two younger children who attend school but are not currently sick. She denies SOB, rhinorrhea ,cough or fever. Patient took tylenol last night with no relief. Patient denies any recent sick contacts and any history of mononucleosis. She currently takes zyrtec PRN.  History reviewed. No pertinent past medical history. History reviewed. No pertinent past surgical history. No family history on file. History  Substance Use Topics  . Smoking status: Never Smoker   . Smokeless tobacco: Not on file  . Alcohol Use: No   OB History   Grav Para Term Preterm Abortions TAB SAB Ect Mult Living   3 3 3  0 0 0 0 0 0 3     Review of Systems  Constitutional: Positive for chills. Negative for fever.  HENT: Positive for sore throat. Negative for congestion, drooling, ear pain and trouble swallowing.   Respiratory: Negative for cough and shortness of breath.   Cardiovascular: Negative for chest pain.  Gastrointestinal: Negative for nausea, vomiting and diarrhea.  Musculoskeletal: Positive for myalgias. Negative for back pain and neck pain.  All other systems reviewed and are negative.   Allergies   Review of patient's allergies indicates no known allergies.  Home Medications   Current Outpatient Rx  Name  Route  Sig  Dispense  Refill  . acetaminophen (TYLENOL) 500 MG tablet   Oral   Take 500 mg by mouth every 6 (six) hours as needed for pain.         . cetirizine (ZYRTEC) 10 MG tablet   Oral   Take 10 mg by mouth daily as needed for allergies.         Marland Kitchen PRESCRIPTION MEDICATION   Intramuscular   Inject 1 application into the muscle every other day. Pt getting allergy shots.         Marland Kitchen amoxicillin (AMOXIL) 500 MG capsule   Oral   Take 1 capsule (500 mg total) by mouth 2 (two) times daily.   20 capsule   0   . HYDROcodone-acetaminophen (HYCET) 7.5-325 mg/15 ml solution   Oral   Take 15 mLs by mouth every 8 (eight) hours as needed for moderate pain.   120 mL   0    Triage Vitals: BP 126/76  Pulse 99  Temp(Src) 98.4 F (36.9 C) (Oral)  Resp 20  SpO2 100%  LMP 08/25/2013  Physical Exam  Nursing note and vitals reviewed. Constitutional: She is oriented to person, place, and time. She appears well-developed and well-nourished. No distress.  HENT:  Head: Normocephalic and atraumatic.  Right Ear: External ear normal. No mastoid tenderness.  Left Ear: External ear normal. No mastoid tenderness.  Nose: Nose normal.  Mouth/Throat: Uvula is midline and mucous membranes are normal. No oral lesions. No trismus in the jaw. No uvula swelling. Oropharyngeal exudate and posterior oropharyngeal erythema present. No posterior oropharyngeal edema or tonsillar abscesses.  Uvula midline and patient tolerating secretions without difficulty  Eyes: Conjunctivae and EOM are normal. Pupils are equal, round, and reactive to light. No scleral icterus.  Neck: Normal range of motion. Neck supple.  No nuchal rigidity or meningismus.  Cardiovascular: Normal rate, regular rhythm and normal heart sounds.   Pulmonary/Chest: Effort normal and breath sounds normal. No stridor. No respiratory  distress. She has no wheezes. She has no rales.  Musculoskeletal: Normal range of motion.  Lymphadenopathy:    She has cervical adenopathy (tender).  Neurological: She is alert and oriented to person, place, and time.  Skin: Skin is warm and dry. No rash noted. She is not diaphoretic. No erythema. No pallor.  Psychiatric: She has a normal mood and affect. Her behavior is normal.    ED Course  Procedures (including critical care time)  DIAGNOSTIC STUDIES: Oxygen Saturation is 100% on RA, nml by my interpretation.    COORDINATION OF CARE: 12:01 AM-Treatment plan to treat as strep throat discussed with patient and patient agrees.  Labs Review Labs Reviewed  RAPID STREP SCREEN  CULTURE, GROUP A STREP   Imaging Review No results found.  EKG Interpretation   None       MDM   1. Pharyngitis    38 year old female presents for sore throat in the absence of cough with associated chills and myalgias. Patient as well and nontoxic appearing, hemodynamically stable, and afebrile. Physical exam with posterior oropharyngeal erythema with exudates. Uvula midline without evidence of peritonsillar abscess. Airway patent and patient tolerating secretions without difficulty. No nuchal rigidity or meningismus. Rapid strep screen negative; however, given physical exam findings strongly suggestive of strep pharyngitis will treat with 10 day course of Amoxicillin. Hycet prescribed for sore throat. Return precautions provided and patient agreeable to plan with unaddressed concerns.  I personally performed the services described in this documentation, which was scribed in my presence. The recorded information has been reviewed and is accurate.     Antonietta Breach, PA-C 09/17/13 [phone]

## 2013-09-17 MED ORDER — AMOXICILLIN 500 MG PO CAPS: 500.0000 mg | ORAL_CAPSULE | Freq: Two times a day (BID) | ORAL | Status: DC

## 2013-09-17 MED ORDER — HYDROCODONE-ACETAMINOPHEN 7.5-325 MG/15ML PO SOLN: 15.0000 mL | Freq: Three times a day (TID) | ORAL | Status: DC | PRN

## 2013-09-17 NOTE — ED Provider Notes (Signed)
Medical screening examination/treatment/procedure(s) were performed by non-physician practitioner and as supervising physician I was immediately available for consultation/collaboration.  EKG Interpretation   None         Julianne Rice, MD 09/17/13 [phone]

## 2013-09-17 NOTE — Discharge Instructions (Signed)
Strep Throat  Strep throat is an infection of the throat caused by a bacteria named Streptococcus pyogenes. Your caregiver may call the infection streptococcal "tonsillitis" or "pharyngitis" depending on whether there are signs of inflammation in the tonsils or back of the throat. Strep throat is most common in children aged 36 15 years during the cold months of the year, but it can occur in people of any age during any season. This infection is spread from person to person (contagious) through coughing, sneezing, or other close contact.  SYMPTOMS   · Fever or chills.  · Painful, swollen, red tonsils or throat.  · Pain or difficulty when swallowing.  · White or yellow spots on the tonsils or throat.  · Swollen, tender lymph nodes or "glands" of the neck or under the jaw.  · Red rash all over the body (rare).  DIAGNOSIS   Many different infections can cause the same symptoms. A test must be done to confirm the diagnosis so the right treatment can be given. A "rapid strep test" can help your caregiver make the diagnosis in a few minutes. If this test is not available, a light swab of the infected area can be used for a throat culture test. If a throat culture test is done, results are usually available in a day or two.  TREATMENT   Strep throat is treated with antibiotic medicine.  HOME CARE INSTRUCTIONS   · Gargle with 1 tsp of salt in 1 cup of warm water, 3 4 times per day or as needed for comfort.  · Family members who also have a sore throat or fever should be tested for strep throat and treated with antibiotics if they have the strep infection.  · Make sure everyone in your household washes their hands well.  · Do not share food, drinking cups, or personal items that could cause the infection to spread to others.  · You may need to eat a soft food diet until your sore throat gets better.  · Drink enough water and fluids to keep your urine clear or pale yellow. This will help prevent dehydration.  · Get plenty of  rest.  · Stay home from school, daycare, or work until you have been on antibiotics for 24 hours.  · Only take over-the-counter or prescription medicines for pain, discomfort, or fever as directed by your caregiver.  · If antibiotics are prescribed, take them as directed. Finish them even if you start to feel better.  SEEK MEDICAL CARE IF:   · The glands in your neck continue to enlarge.  · You develop a rash, cough, or earache.  · You cough up green, yellow-brown, or bloody sputum.  · You have pain or discomfort not controlled by medicines.  · Your problems seem to be getting worse rather than better.  SEEK IMMEDIATE MEDICAL CARE IF:   · You develop any new symptoms such as vomiting, severe headache, stiff or painful neck, chest pain, shortness of breath, or trouble swallowing.  · You develop severe throat pain, drooling, or changes in your voice.  · You develop swelling of the neck, or the skin on the neck becomes red and tender.  · You have a fever.  · You develop signs of dehydration, such as fatigue, dry mouth, and decreased urination.  · You become increasingly sleepy, or you cannot wake up completely.  Document Released: 08/10/2000 Document Revised: 07/30/2012 Document Reviewed: 10/12/2010  [employer]® Patient Information ©2014 [employer].  Salt Water   Gargle  This solution will help make your mouth and throat feel better.  HOME CARE INSTRUCTIONS   · Mix 1 teaspoon of salt in 8 ounces of warm water.  · Gargle with this solution as much or often as you need or as directed. Swish and gargle gently if you have any sores or wounds in your mouth.  · Do not swallow this mixture.  Document Released: 05/17/2004 Document Revised: 11/05/2011 Document Reviewed: 10/08/2008  [employer]® Patient Information ©2014 [employer].

## 2013-09-18 LAB — CULTURE, GROUP A STREP

## 2014-06-28 ENCOUNTER — Encounter (HOSPITAL_COMMUNITY): Payer: Self-pay | Admitting: Emergency Medicine

## 2015-04-02 ENCOUNTER — Emergency Department (HOSPITAL_COMMUNITY): Payer: Medicaid Other

## 2015-04-02 ENCOUNTER — Encounter (HOSPITAL_COMMUNITY): Payer: Self-pay | Admitting: Emergency Medicine

## 2015-04-02 ENCOUNTER — Emergency Department (HOSPITAL_COMMUNITY)
Admission: EM | Admit: 2015-04-02 | Discharge: 2015-04-02 | Disposition: A | Discharge status: Home / Self Care | Payer: Medicaid Other | Attending: Emergency Medicine | Admitting: Emergency Medicine

## 2015-04-02 DIAGNOSIS — Y939 Activity, unspecified: Secondary | ICD-10-CM | POA: Diagnosis not present

## 2015-04-02 DIAGNOSIS — W1839XA Other fall on same level, initial encounter: Secondary | ICD-10-CM | POA: Diagnosis not present

## 2015-04-02 DIAGNOSIS — S40021A Contusion of right upper arm, initial encounter: Secondary | ICD-10-CM

## 2015-04-02 DIAGNOSIS — Y999 Unspecified external cause status: Secondary | ICD-10-CM | POA: Insufficient documentation

## 2015-04-02 DIAGNOSIS — Z792 Long term (current) use of antibiotics: Secondary | ICD-10-CM | POA: Diagnosis not present

## 2015-04-02 DIAGNOSIS — S4991XA Unspecified injury of right shoulder and upper arm, initial encounter: Secondary | ICD-10-CM | POA: Diagnosis present

## 2015-04-02 DIAGNOSIS — Y929 Unspecified place or not applicable: Secondary | ICD-10-CM | POA: Diagnosis not present

## 2015-04-02 MED ORDER — IBUPROFEN 200 MG PO TABS
600.0000 mg | ORAL_TABLET | Freq: Once | ORAL | Status: AC
  Administered 2015-04-02: 600 mg via ORAL
  Filled 2015-04-02: qty 3

## 2015-04-02 MED ORDER — OXYCODONE-ACETAMINOPHEN 5-325 MG PO TABS
1.0000 | ORAL_TABLET | Freq: Once | ORAL | Status: AC
  Administered 2015-04-02: 1 via ORAL
  Filled 2015-04-02: qty 1

## 2015-04-02 MED ORDER — TRAMADOL HCL 50 MG PO TABS: 50.0000 mg | ORAL_TABLET | Freq: Four times a day (QID) | ORAL | Status: DC | PRN

## 2015-04-02 NOTE — ED Notes (Signed)
Pt from home c/ right forearm pain from a fall on Monday. Large bruising noted to posterior lower arm.

## 2015-04-02 NOTE — ED Provider Notes (Signed)
CSN: [id number]     Arrival date & time 04/02/15  0033 History   This chart was scribed for Renee Manifold, MD by Forrestine Him, ED Scribe. This patient was seen in room WA04/WA04 and the patient's care was started 1:25 AM.   Chief Complaint  Patient presents with  . Arm Injury   The history is provided by the patient. No language interpreter was used.    HPI Comments: Renee Roy is a 39 y.o. female without any pertinent past medical history who presents to the Emergency Department complaining of constant, ongoing, unchanged R arm pain x 4 days. Pt states she sustained a fall a few days ago and noted discomfort afterwards. OTC Ibuprofen and ice application to arm attempted prior to arrival without any improvement for symptoms. No recent fever or chills. No weakness, loss of sensation, or numbness to arm. No known allergies to medications.  History reviewed. No pertinent past medical history. History reviewed. No pertinent past surgical history. No family history on file. History  Substance Use Topics  . Smoking status: Never Smoker   . Smokeless tobacco: Not on file  . Alcohol Use: No   OB History    Gravida Para Term Preterm AB TAB SAB Ectopic Multiple Living   3 3 3  0 0 0 0 0 0 3     Review of Systems  Constitutional: Negative for fever and chills.  Respiratory: Negative for cough and shortness of breath.   Cardiovascular: Negative for chest pain.  Gastrointestinal: Negative for nausea, vomiting and abdominal pain.  Musculoskeletal: Positive for arthralgias. Negative for joint swelling.  Neurological: Negative for weakness and numbness.  Psychiatric/Behavioral: Negative for confusion.  All other systems reviewed and are negative.     Allergies  Review of patient's allergies indicates no known allergies.  Home Medications   Prior to Admission medications   Medication Sig Start Date End Date Taking? Authorizing Provider  acetaminophen (TYLENOL) 500 MG tablet Take 500 mg by  mouth every 6 (six) hours as needed for pain.    Historical Provider, MD  amoxicillin (AMOXIL) 500 MG capsule Take 1 capsule (500 mg total) by mouth 2 (two) times daily. 09/17/13   Antonietta Breach, PA-C  cetirizine (ZYRTEC) 10 MG tablet Take 10 mg by mouth daily as needed for allergies.    Historical Provider, MD  HYDROcodone-acetaminophen (HYCET) 7.5-325 mg/15 ml solution Take 15 mLs by mouth every 8 (eight) hours as needed for moderate pain. 09/17/13   Antonietta Breach, PA-C  PRESCRIPTION MEDICATION Inject 1 application into the muscle every other day. Pt getting allergy shots.    Historical Provider, MD   Triage Vitals: BP 134/88 mmHg  Pulse 76  Temp(Src) 97.5 F (36.4 C) (Oral)  SpO2 100%  LMP 03/02/2015   Physical Exam  Constitutional: She is oriented to person, place, and time. She appears well-developed and well-nourished.  HENT:  Head: Normocephalic.  Eyes: EOM are normal.  Neck: Normal range of motion.  Pulmonary/Chest: Effort normal.  Abdominal: She exhibits no distension.  Musculoskeletal: Normal range of motion.  Mild tenderness to R triceps and proximal forearm Able to actively range R elbow and wrist N/V intact   Neurological: She is alert and oriented to person, place, and time.  Psychiatric: She has a normal mood and affect.  Nursing note and vitals reviewed.   ED Course  Procedures (including critical care time)  DIAGNOSTIC STUDIES: Oxygen Saturation is 100% on RA, Normal by my interpretation.    COORDINATION OF CARE:  1:30 AM- Will order DG forearm R and DG humerus R. Discussed treatment plan with pt at bedside and pt agreed to plan.     Labs Review Labs Reviewed - No data to display  Imaging Review No results found.   Dg Forearm Right  04/02/2015   CLINICAL DATA:  Acute onset of right forearm pain after fall. Initial encounter.  EXAM: RIGHT FOREARM - 2 VIEW  COMPARISON:  None.  FINDINGS: There is no evidence of fracture or dislocation. The radius and ulna are  unremarkable in appearance. Negative ulnar variance is noted. The carpal rows appear grossly intact, and demonstrate normal alignment. No elbow joint effusion is identified. Mild dorsal soft tissue swelling is noted along the proximal forearm.  IMPRESSION: No evidence of fracture or dislocation.   Electronically Signed   By: Garald Balding M.D.   On: 04/02/2015 01:25   Dg Humerus Right  04/02/2015   CLINICAL DATA:  Status post fall, with right arm pain. Initial encounter.  EXAM: RIGHT HUMERUS - 2+ VIEW  COMPARISON:  None.  FINDINGS: There is no evidence of fracture or dislocation. The right humerus appears intact. The right humeral head remains seated at the glenoid fossa. The right acromioclavicular joint is unremarkable in appearance. The elbow joint is incompletely assessed, but appears grossly unremarkable. No definite soft tissue abnormalities are characterized on radiograph.  IMPRESSION: No evidence of fracture or dislocation.   Electronically Signed   By: Garald Balding M.D.   On: 04/02/2015 01:26     EKG Interpretation None      MDM   Final diagnoses:  Arm contusion, right, initial encounter    39 year old female with forearm pain after fall on Monday. Closed injury. Neurovascularly intact. Negative imaging. Plan symptomatic treatment with what is clinically consistent with contusion.  I personally preformed the services scribed in my presence. The recorded information has been reviewed is accurate. Renee Manifold, MD.   Renee Manifold, MD 04/10/15 [phone]

## 2016-02-06 ENCOUNTER — Emergency Department (HOSPITAL_COMMUNITY)
Admission: EM | Admit: 2016-02-06 | Discharge: 2016-02-06 | Disposition: A | Discharge status: Home / Self Care | Payer: Medicaid Other | Attending: Emergency Medicine | Admitting: Emergency Medicine

## 2016-02-06 ENCOUNTER — Encounter (HOSPITAL_COMMUNITY): Payer: Self-pay | Admitting: Emergency Medicine

## 2016-02-06 DIAGNOSIS — Y929 Unspecified place or not applicable: Secondary | ICD-10-CM | POA: Insufficient documentation

## 2016-02-06 DIAGNOSIS — T300 Burn of unspecified body region, unspecified degree: Secondary | ICD-10-CM

## 2016-02-06 DIAGNOSIS — T2006XA Burn of unspecified degree of forehead and cheek, initial encounter: Secondary | ICD-10-CM | POA: Diagnosis present

## 2016-02-06 DIAGNOSIS — Z79899 Other long term (current) drug therapy: Secondary | ICD-10-CM | POA: Insufficient documentation

## 2016-02-06 DIAGNOSIS — Y999 Unspecified external cause status: Secondary | ICD-10-CM | POA: Diagnosis not present

## 2016-02-06 DIAGNOSIS — Y939 Activity, unspecified: Secondary | ICD-10-CM | POA: Diagnosis not present

## 2016-02-06 DIAGNOSIS — T2016XA Burn of first degree of forehead and cheek, initial encounter: Secondary | ICD-10-CM | POA: Diagnosis not present

## 2016-02-06 DIAGNOSIS — X19XXXA Contact with other heat and hot substances, initial encounter: Secondary | ICD-10-CM | POA: Diagnosis not present

## 2016-02-06 MED ORDER — IBUPROFEN 400 MG PO TABS: 400.0000 mg | ORAL_TABLET | Freq: Four times a day (QID) | ORAL | Status: DC | PRN

## 2016-02-06 MED ORDER — IBUPROFEN 400 MG PO TABS
400.0000 mg | ORAL_TABLET | Freq: Once | ORAL | Status: AC
  Administered 2016-02-06: 400 mg via ORAL
  Filled 2016-02-06: qty 1

## 2016-02-06 MED ORDER — BACITRACIN ZINC 500 UNIT/GM EX OINT: 1.0000 "application " | TOPICAL_OINTMENT | Freq: Two times a day (BID) | CUTANEOUS | Status: DC

## 2016-02-06 MED ORDER — BACITRACIN ZINC 500 UNIT/GM EX OINT
TOPICAL_OINTMENT | Freq: Once | CUTANEOUS | Status: AC
  Administered 2016-02-06: 1 via TOPICAL
  Filled 2016-02-06: qty 0.9
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
## 2016-06-02 ENCOUNTER — Emergency Department (HOSPITAL_COMMUNITY)
Admission: EM | Admit: 2016-06-02 | Discharge: 2016-06-02 | Disposition: A | Discharge status: Home / Self Care | Payer: Medicaid Other | Attending: Emergency Medicine | Admitting: Emergency Medicine

## 2016-06-02 ENCOUNTER — Encounter (HOSPITAL_COMMUNITY): Payer: Self-pay | Admitting: Emergency Medicine

## 2016-06-02 DIAGNOSIS — Y929 Unspecified place or not applicable: Secondary | ICD-10-CM | POA: Insufficient documentation

## 2016-06-02 DIAGNOSIS — X58XXXA Exposure to other specified factors, initial encounter: Secondary | ICD-10-CM | POA: Insufficient documentation

## 2016-06-02 DIAGNOSIS — Y939 Activity, unspecified: Secondary | ICD-10-CM | POA: Insufficient documentation

## 2016-06-02 DIAGNOSIS — S199XXA Unspecified injury of neck, initial encounter: Secondary | ICD-10-CM | POA: Diagnosis present

## 2016-06-02 DIAGNOSIS — S161XXA Strain of muscle, fascia and tendon at neck level, initial encounter: Secondary | ICD-10-CM | POA: Diagnosis not present

## 2016-06-02 DIAGNOSIS — Y999 Unspecified external cause status: Secondary | ICD-10-CM | POA: Insufficient documentation

## 2016-06-02 MED ORDER — IBUPROFEN 600 MG PO TABS: 600.0000 mg | ORAL_TABLET | Freq: Four times a day (QID) | ORAL | 0 refills | Status: DC | PRN

## 2016-06-02 MED ORDER — METHOCARBAMOL 500 MG PO TABS: 500.0000 mg | ORAL_TABLET | Freq: Two times a day (BID) | ORAL | 0 refills | Status: DC | PRN

## 2016-06-02 NOTE — ED Provider Notes (Signed)
[facility] DEPT Provider Note   CSN: [id number] Arrival date & time: 06/02/16  2101   By signing my name below, I, Renee Roy, attest that this documentation has been prepared under the direction and in the presence of non-physician practitioner, Pearlie Oyster, PA-C. Electronically Signed: Dolores Roy, Scribe. 06/02/2016. 9:39 PM.   History   Chief Complaint Chief Complaint  Patient presents with  . Neck Pain   The history is provided by the patient. No language interpreter was used.     HPI Comments:  Renee Roy is an otherwise healthy 39 y.o. female who presents to the Emergency Department complaining of gradually worsening constant left sided neck pain beginning two weeks ago. Pt reports that her pain has worsened only in the past two days. She has taken tylenol, but reports that it has given her no relief. Her pain is worsened on palpation and movement of her neck. She denies cough, congestion, or feve/chills. No known injury or change in activity.   History reviewed. No pertinent past medical history.  There are no active problems to display for this patient.   History reviewed. No pertinent surgical history.  OB History    Gravida Para Term Preterm AB Living   3 3 3  0 0 3   SAB TAB Ectopic Multiple Live Births   0 0 0 0 3       Home Medications    Prior to Admission medications   Medication Sig Start Date End Date Taking? Authorizing Provider  bacitracin ointment Apply 1 application topically 2 (two) times daily. 02/06/16   Merryl Hacker, MD  cetirizine (ZYRTEC) 10 MG tablet Take 10 mg by mouth daily as needed for allergies.    Historical Provider, MD  ibuprofen (ADVIL,MOTRIN) 600 MG tablet Take 1 tablet (600 mg total) by mouth every 6 (six) hours as needed. 06/02/16   Ozella Almond Chanler Mendonca, PA-C  methocarbamol (ROBAXIN) 500 MG tablet Take 1 tablet (500 mg total) by mouth 2 (two) times daily as needed for muscle spasms. 06/02/16   Jerrion Tabbert Pilcher Taleeyah Bora, PA-C    MONONESSA 0.25-35 MG-MCG tablet Take 1 tablet by mouth daily. 02/15/15   Historical Provider, MD  naproxen (NAPROSYN) 500 MG tablet Take 500 mg by mouth 2 (two) times daily as needed. For pain. 03/24/15   Historical Provider, MD  traMADol (ULTRAM) 50 MG tablet Take 1 tablet (50 mg total) by mouth every 6 (six) hours as needed. 04/02/15   [name], MD    Family History No family history on file.  Social History Social History  Substance Use Topics  . Smoking status: Never Smoker  . Smokeless tobacco: Never Used  . Alcohol use No     Allergies   Review of patient's allergies indicates no known allergies.   Review of Systems Review of Systems  Constitutional: Negative for fever.  HENT: Negative for congestion.   Respiratory: Negative for cough.   Musculoskeletal: Positive for neck pain. Negative for back pain.  Skin: Negative for color change.     Physical Exam Updated Vital Signs BP 140/95 (BP Location: Left Arm)   Pulse 79   Temp 97.6 F (36.4 C) (Oral)   Resp 16   Ht 5\' 2"  (1.575 m)   Wt 63 kg   LMP 05/23/2016 (Within Days)   SpO2 100%   BMI 25.42 kg/m   Physical Exam  Constitutional: She is oriented to person, place, and time. She appears well-developed and well-nourished. No distress.  HENT:  Head:  Normocephalic and atraumatic.  Mouth/Throat: Oropharynx is clear and moist.  Neck:  No midline tenderness. Tenderness to palpation and visible muscle spasm of right paraspinal musculature. Decreased ROM secondary to pain.  Cardiovascular: Normal rate, regular rhythm and normal heart sounds.   No murmur heard. Pulmonary/Chest: Effort normal and breath sounds normal. No respiratory distress.  Neurological: She is alert and oriented to person, place, and time.  Skin: Skin is warm and dry.  Psychiatric: She has a normal mood and affect.  Nursing note and vitals reviewed.    ED Treatments / Results  DIAGNOSTIC STUDIES:  Oxygen Saturation is 100% on RA,  normal by my interpretation.    COORDINATION OF CARE:  9:43 PM Discussed treatment plan with pt at bedside which includes muscle relaxants and ice/heat and pt agreed to plan.  Labs (all labs ordered are listed, but only abnormal results are displayed) Labs Reviewed - No data to display  EKG  EKG Interpretation None       Radiology No results found.  Procedures Procedures (including critical care time)  Medications Ordered in ED Medications - No data to display   Initial Impression / Assessment and Plan / ED Course  I have reviewed the triage vital signs and the nursing notes.  Pertinent labs & imaging results that were available during my care of the patient were reviewed by me and considered in my medical decision making (see chart for details).  Clinical Course     Final Clinical Impressions(s) / ED Diagnoses   Final diagnoses:  Strain of neck muscle, initial encounter   Renee Roy is a 40 y.o. female who presents to ED for right sided neck pain x 2 weeks. Exam consistent with muscle strain. Symptomatic home care instructions discussed. Rx for robaxin and ibuprofen given. PCP follow up if symptoms persist. Reasons to return to ED discussed and all questions answered.   Blood pressure 140/95, pulse 79, temperature 97.6 F (36.4 C), temperature source Oral, resp. rate 16, height 5\' 2"  (1.575 m), weight 63 kg, last menstrual period 05/23/2016, SpO2 100 %.   New Prescriptions Discharge Medication List as of 06/02/2016  9:56 PM    START taking these medications   Details  methocarbamol (ROBAXIN) 500 MG tablet Take 1 tablet (500 mg total) by mouth 2 (two) times daily as needed for muscle spasms., Starting Sat 06/02/2016, Print       I personally performed the services described in this documentation, which was scribed in my presence. The recorded information has been reviewed and is accurate.    [facility] Raynor Calcaterra, PA-C 06/02/16 [place],  MD 06/03/16 [phone]

## 2016-06-02 NOTE — ED Notes (Signed)
Patient verbalized understanding of discharge instructions and denies any further needs or questions at this time. VS stable. Patient ambulatory with steady gait, declined wheelchair. Escorted to ED entrance.

## 2016-06-02 NOTE — ED Notes (Signed)
PA-C to see and assess pt before RN assessment, pt currently up for discharge and in no signs of acute distress. See PA note.

## 2016-06-02 NOTE — Discharge Instructions (Signed)
Take ibuprofen as needed for pain. Robaxin is your muscle relaxer as needed - This can make you very drowsy - please do not drink alcohol, operate heavy machinery or drive on this medication. Return to ER for new or worsening symptoms, any additional concerns.

## 2016-06-02 NOTE — ED Triage Notes (Signed)
Pt repots neck pain on the R side x2 weeks, worse in the last two days. States no injury that she can recall. Decreased mobility. Tylenol not effective for pain. No swelling/deformity noted in triage.

## 2017-03-14 ENCOUNTER — Other Ambulatory Visit: Payer: Self-pay | Admitting: Family Medicine

## 2017-03-14 ENCOUNTER — Ambulatory Visit
Admission: RE | Admit: 2017-03-14 | Discharge: 2017-03-14 | Disposition: A | Discharge status: Home / Self Care | Payer: Medicaid Other | Source: Ambulatory Visit | Attending: Family Medicine | Admitting: Family Medicine

## 2017-03-14 DIAGNOSIS — M79675 Pain in left toe(s): Secondary | ICD-10-CM

## 2017-06-10 ENCOUNTER — Ambulatory Visit (INDEPENDENT_AMBULATORY_CARE_PROVIDER_SITE_OTHER): Payer: Medicaid Other

## 2017-06-10 ENCOUNTER — Ambulatory Visit (INDEPENDENT_AMBULATORY_CARE_PROVIDER_SITE_OTHER): Payer: Medicaid Other | Admitting: Orthopedic Surgery

## 2017-06-10 ENCOUNTER — Encounter (INDEPENDENT_AMBULATORY_CARE_PROVIDER_SITE_OTHER): Payer: Self-pay | Admitting: Family

## 2017-06-10 VITALS — Ht 63.0 in | Wt 143.0 lb

## 2017-06-10 DIAGNOSIS — M25562 Pain in left knee: Secondary | ICD-10-CM

## 2017-06-10 DIAGNOSIS — M25561 Pain in right knee: Secondary | ICD-10-CM | POA: Diagnosis not present

## 2017-06-10 DIAGNOSIS — G8929 Other chronic pain: Secondary | ICD-10-CM | POA: Diagnosis not present

## 2017-06-10 NOTE — Progress Notes (Signed)
Office Visit Note   Patient: Renee Roy           Date of Birth: Jul 20, 1976           MRN: [id number] Visit Date: 06/10/2017              Requested by: Lin Landsman, [address] [place], [place] [address] PCP: Lin Landsman, MD  Chief Complaint  Patient presents with  . Left Knee - Pain  . Right Knee - Pain      HPI: Patient is a 41 year old woman who states that she's had chronic knee pain in both knees since she was in middle school. She states she did have injections in her knees at that time and this did help. She states her mother also has osteoarthritis in both knees. Patient states that with Naprosyn her pain is completely resolved she has no limitations with her ability no pain with going up or down hill no pain going up or down stairs when she takes Naprosyn.  Assessment & Plan: Visit Diagnoses:  1. Chronic pain of left knee   2. Chronic pain of right knee     Plan: we will set her up for physical therapy for quad and hamstring strengthening for both knees.  Follow-Up Instructions: Return if symptoms worsen or fail to improve.   Ortho Exam  Patient is alert, oriented, no adenopathy, well-dressed, normal affect, normal respiratory effort. Examination patient has a normal gait. There is no effusion in either knee she states she has not had any effusions. Collaterals and cruciates are stable bilaterally. The medial lateral joint lines are nontender to palpation. Patient does have some crepitation with range of motion the patellofemoral joint and patient states that this is the source of her pain deep to the medial lateral facets of the patella.  Imaging: Xr Knee 3 View Left  Result Date: 06/10/2017 2 view radiographs of the left knee shows a congruent medial and lateral joint space no subcondylar cysts or sclerosis no periarticular bone spurs.  Xr Knee 3 View Right  Result Date: 06/10/2017 Three-view radiographs of the right knee shows a congruent medial and  lateral joint space with no subcondylar cysts no subcondylar sclerosis no periarticular bone spurs.  No images are attached to the encounter.  Labs: Lab Results  Component Value Date   REPTSTATUS 09/18/2013 FINAL 09/16/2013   CULT  09/16/2013    STREPTOCOCCUS,BETA HEMOLYTIC NOT GROUP A Performed at [facility]    Orders:  Orders Placed This Encounter  Procedures  . XR KNEE 3 VIEW LEFT  . XR KNEE 3 VIEW RIGHT   No orders of the defined types were placed in this encounter.    Procedures: No procedures performed  Clinical Data: No additional findings.  ROS:  All other systems negative, except as noted in the HPI. Review of Systems  Objective: Vital Signs: Ht 5\' 3"  (1.6 m)   Wt 143 lb (64.9 kg)   BMI 25.33 kg/m   Specialty Comments:  No specialty comments available.  PMFS History: There are no active problems to display for this patient.  No past medical history on file.  No family history on file.  No past surgical history on file. Social History   Occupational History  . Not on file.   Social History Main Topics  . Smoking status: Never Smoker  . Smokeless tobacco: Never Used  . Alcohol use No  . Drug use: No  . Sexual activity: Yes  Birth control/ protection: Implant

## 2017-06-25 ENCOUNTER — Encounter: Payer: Self-pay | Admitting: Physical Therapy

## 2017-06-25 ENCOUNTER — Ambulatory Visit: Payer: Medicaid Other | Attending: Orthopedic Surgery | Admitting: Physical Therapy

## 2017-06-25 DIAGNOSIS — G8929 Other chronic pain: Secondary | ICD-10-CM | POA: Insufficient documentation

## 2017-06-25 DIAGNOSIS — M25562 Pain in left knee: Secondary | ICD-10-CM | POA: Insufficient documentation

## 2017-06-25 NOTE — Therapy (Signed)
[place], [place], [address] Phone: [phone]   Fax:  [phone]  Physical Therapy Evaluation  Patient Details  Name: Renee Roy MRN: [id number] Date of Birth: October 03, 1975 Referring Provider: Sharol Given  Encounter Date: 06/25/2017      PT End of Session - 06/25/17 1001    Visit Number 1   Authorization Type Medicaid allows 1 visit   PT Start Time 0930   PT Stop Time 3329   PT Time Calculation (min) 45 min   Activity Tolerance Patient tolerated treatment well   Behavior During Therapy [facility] for tasks assessed/performed      History reviewed. No pertinent past medical history.  History reviewed. No pertinent surgical history.  There were no vitals filed for this visit.       Subjective Assessment - 06/25/17 0928    Subjective Patient reports that she has knee pain for a long time, she is unsure of any specific cause.  X-rays negative.  Reports that walking and stairs hurt the most   Limitations Walking   Patient Stated Goals have less pain   Currently in Pain? Yes   Pain Score 0-No pain   Pain Location Knee   Pain Orientation Right;Left   Pain Descriptors / Indicators Aching   Pain Type Chronic pain   Pain Onset More than a month ago   Pain Frequency Intermittent   Aggravating Factors  walking and stairs pain up to 8/10   Pain Relieving Factors sitting pain is 0/10   Effect of Pain on Daily Activities difficulty walking and doing stairs            [facility] PT Assessment - 06/25/17 0001      Assessment   Medical Diagnosis knee pain   Referring Provider Sharol Given   Onset Date/Surgical Date 05/26/17   Prior Therapy no     Precautions   Precautions None     Balance Screen   Has the patient fallen in the past 6 months No   Has the patient had a decrease in activity level because of a fear of falling?  No   Is the patient reluctant to leave their home because of a fear of falling?  No      Home Environment   Additional Comments some steps, does housework, has a 89, 57 and 41 years old     Prior Function   Level of Independence Independent   Architect   Vocation Requirements [employer], study Development worker, community, has stairs at school and reports that she has to do a lot of walking   Leisure no exercise     ROM / Strength   AROM / PROM / Strength AROM;Strength     AROM   Overall AROM Comments Knee ROM is WNL's     Strength   Overall Strength Comments 4/5 for the knees     Flexibility   Soft Tissue Assessment /Muscle Length --  tight HS, very tight ITB and tight piriformis     Palpation   Palpation comment lateral tracking patella, mild crepitus     Ambulation/Gait   Gait Comments normal gait            Objective measurements completed on examination: See above findings.                  PT Education - 06/25/17 1001    Education provided Yes   Education Details HEP for flexibility  and for VMO   Person(s) Educated Patient   Methods Explanation;Demonstration;Handout;Tactile cues   Comprehension Verbalized understanding;Returned demonstration;Tactile cues required             PT Long Term Goals - 06/25/17 1004      PT LONG TERM GOAL #1   Title independent with HEP   Time 1   Period Days   Status Achieved                Plan - 06/25/17 1002    Clinical Impression Statement Patient reports knee pain for 3 years, she reports that she has been on Naproxen for 3 years and as long as she takes the medicine she has no pain, but that she has some with stairs and walking  She has tight HS and ITB, weak VMO and lateral tracking patella, x-rays were negative.   Clinical Presentation Stable   Clinical Decision Making Low   Rehab Potential Good   PT Frequency One time visit   PT Treatment/Interventions Patient/family education;Therapeutic exercise   PT Next Visit Plan Medicaid allows one visit per year.  She is to do the  HEP   Consulted and Agree with Plan of Care Patient      Patient will benefit from skilled therapeutic intervention in order to improve the following deficits and impairments:  Pain, Increased muscle spasms, Impaired flexibility, Difficulty walking  Visit Diagnosis: Chronic pain of left knee - Plan: PT plan of care cert/re-cert     Problem List There are no active problems to display for this patient.   Sumner Boast., PT 06/25/2017, 10:05 AM  [place], [place], [address] Phone: [phone]   Fax:  [phone]  Name: Renee Roy MRN: [id number] Date of Birth: Jan 25, 1976

## 2017-07-05 ENCOUNTER — Emergency Department (HOSPITAL_COMMUNITY)
Admission: EM | Admit: 2017-07-05 | Discharge: 2017-07-06 | Disposition: A | Discharge status: Home / Self Care | Payer: Medicaid Other | Attending: Emergency Medicine | Admitting: Emergency Medicine

## 2017-07-05 ENCOUNTER — Other Ambulatory Visit: Payer: Self-pay

## 2017-07-05 ENCOUNTER — Encounter (HOSPITAL_COMMUNITY): Payer: Self-pay

## 2017-07-05 DIAGNOSIS — K0889 Other specified disorders of teeth and supporting structures: Secondary | ICD-10-CM | POA: Diagnosis present

## 2017-07-05 NOTE — ED Triage Notes (Signed)
Pt endorses left lower dental pain that began today. Pt called her dentist today but they did not have an opening until Monday but pt did not schedule appointment. VSS.

## 2017-07-05 NOTE — ED Notes (Signed)
See provider assessment 

## 2017-07-06 MED ORDER — AMOXICILLIN-POT CLAVULANATE 875-125 MG PO TABS
1.0000 | ORAL_TABLET | Freq: Two times a day (BID) | ORAL | 0 refills | Status: DC
Start: 2017-07-06 — End: 2017-07-06

## 2017-07-06 MED ORDER — NAPROXEN 250 MG PO TABS
500.0000 mg | ORAL_TABLET | Freq: Once | ORAL | Status: AC
  Administered 2017-07-06: 500 mg via ORAL
  Filled 2017-07-06: qty 2

## 2017-07-06 MED ORDER — NAPROXEN 500 MG PO TABS: 500.0000 mg | ORAL_TABLET | Freq: Two times a day (BID) | ORAL | 0 refills | Status: DC | PRN

## 2017-07-06 MED ORDER — AMOXICILLIN-POT CLAVULANATE 875-125 MG PO TABS: 1.0000 | ORAL_TABLET | Freq: Two times a day (BID) | ORAL | 0 refills | Status: DC

## 2017-07-06 MED ORDER — AMOXICILLIN-POT CLAVULANATE 875-125 MG PO TABS: 1.0000 | ORAL_TABLET | Freq: Two times a day (BID) | ORAL | 0 refills | Status: AC

## 2017-07-06 NOTE — Discharge Instructions (Signed)
Please see the information and instructions below regarding your visit.  Your diagnoses today include:  1. Pain, dental    You may have a dental infection. It is very important that you get evaluated by a dentist as soon as possible. Call tomorrow to schedule an appointment. Naproxen as needed for pain. Take your full course of antibiotics. Read the instructions below.  Tests performed today include: See side panel of your discharge paperwork for testing performed today. Vital signs are listed at the bottom of these instructions.   Medications prescribed:    Take any prescribed medications only as prescribed, and any over the counter medications only as directed on the packaging.  1. You are prescribed Augmentin, an antibiotic. Please take all of your antibiotics until finished.   You may develop abdominal discomfort or nausea from the antibiotic. If this occurs, you may take it with food. Some patients also get diarrhea with antibiotics. You may help offset this with probiotics which you can buy or get in yogurt. Do not eat or take the probiotics until 2 hours after your antibiotic. Some women develop vaginal yeast infections after antibiotics. If you develop unusual vaginal discharge after being on this medication, please see your primary care provider.   Some people develop allergies to antibiotics. Symptoms of antibiotic allergy can be mild and include a flat rash and itching. They can also be more serious and include:  ?Hives - Hives are raised, red patches of skin that are usually very itchy.  ?Lip or tongue swelling  ?Trouble swallowing or breathing  ?Blistering of the skin or mouth.  If you have any of these serious symptoms, please seek emergency medical care immediately.  2. You are prescribed Naproxen, a non-steroidal anti-inflammatory agent (NSAID) for pain. You may take 500mg  every 6 hours as needed for pain. If still requiring this medication around the clock for acute pain  after 10 days, please see your primary healthcare provider.  You may combine this medication with Tylenol, 650 mg every 6 hours, so you are receiving something for pain every 3 hours.  This is not a long-term medication unless under the care and direction of your primary provider. Taking this medication long-term and not under the supervision of a healthcare provider could increase the risk of stomach ulcers, kidney problems, and cardiovascular problems such as high blood pressure.   Home care instructions:  Please follow any educational materials contained in this packet.   Eat a soft or liquid diet and rinse your mouth out after meals with warm water. You should see a dentist or return here at once if you have increased swelling, increased pain or uncontrolled bleeding from the site of your injury.  Follow-up instructions: It is very important that you see a dentist as soon as possible. There is a list of dentists attached to this packet if you do not have care established with a dentist already. Please give a call to a dentist of your choice tomorrow.  Return instructions:  Please return to the Emergency Department if you experience worsening symptoms.  Please seek care if you note any of the following about your dental pain:  You have increased pain not controlled with medicines.  You have swelling around your tooth, in your face or neck.  You have bleeding which starts, continues, or gets worse.  You have a fever >101 If you are unable to open your mouth Please return if you have any other emergent concerns.  Additional Information:  Your vital signs today were: BP (!) 140/100    Pulse 88    Temp 98.7 F (37.1 C) (Oral)    Resp 16    Ht 5\' 3"  (1.6 m)    Wt 63.5 kg (140 lb)    LMP 06/14/2017 (Approximate)    SpO2 100%    BMI 24.80 kg/m  If your blood pressure (BP) was elevated on multiple readings during this visit above 130 for the top number or above 80 for the bottom number,  please have this repeated by your primary care provider within one month. --------------  Thank you for allowing [place] to participate in your care today.

## 2017-07-06 NOTE — ED Provider Notes (Signed)
[facility] EMERGENCY DEPARTMENT Provider Note   CSN: [id number] Arrival date & time: 07/05/17  2157     History   Chief Complaint Chief Complaint  Patient presents with  . Dental Pain    HPI Renee Roy is a 41 y.o. female.  HPI   Renee Roy is a 41 y.o. female who presents to the Emergency Department complaining of gradually worsening, left-sided, lower dental pain beginning this morning. Pt describes their pain as throbbing and sharp. Pt has not tried any remedies at home.  Patient reports she called her dentist today, however the dentist told her that she could not get an appointment until Monday.  Pt denies facial swelling, fever, chills, difficulty breathing, difficulty swallowing.   History reviewed. No pertinent past medical history.  There are no active problems to display for this patient.   History reviewed. No pertinent surgical history.  OB History    Gravida Para Term Preterm AB Living   3 3 3  0 0 3   SAB TAB Ectopic Multiple Live Births   0 0 0 0 3       Home Medications    Prior to Admission medications   Medication Sig Start Date End Date Taking? Authorizing Provider  amoxicillin-clavulanate (AUGMENTIN) 875-125 MG tablet Take 1 tablet every 12 (twelve) hours for 7 days by mouth. 07/06/17 07/13/17  Carlisle Cater, PA-C  bacitracin ointment Apply 1 application topically 2 (two) times daily. 02/06/16   Horton, Barbette Hair, MD  cetirizine (ZYRTEC) 10 MG tablet Take 10 mg by mouth daily as needed for allergies.    [provider]  ibuprofen (ADVIL,MOTRIN) 600 MG tablet Take 1 tablet (600 mg total) by mouth every 6 (six) hours as needed. 06/02/16   Ward, Ozella Almond, PA-C  methocarbamol (ROBAXIN) 500 MG tablet Take 1 tablet (500 mg total) by mouth 2 (two) times daily as needed for muscle spasms. 06/02/16   Ward, Ozella Almond, PA-C  MONONESSA 0.25-35 MG-MCG tablet Take 1 tablet by mouth daily. 02/15/15   [provider]    naproxen (NAPROSYN) 500 MG tablet Take 1 tablet (500 mg total) 2 (two) times daily as needed by mouth. For pain. 07/06/17   Carlisle Cater, PA-C  traMADol (ULTRAM) 50 MG tablet Take 1 tablet (50 mg total) by mouth every 6 (six) hours as needed. 04/02/15   [name], MD    Family History History reviewed. No pertinent family history.  Social History Social History   Tobacco Use  . Smoking status: Never Smoker  . Smokeless tobacco: Never Used  Substance Use Topics  . Alcohol use: No  . Drug use: No     Allergies   Patient has no known allergies.   Review of Systems Review of Systems  HENT: Positive for congestion and dental problem. Negative for facial swelling, sore throat, trouble swallowing and voice change.   Respiratory: Negative for shortness of breath.   Gastrointestinal: Negative for nausea and vomiting.     Physical Exam Updated Vital Signs BP 139/87 (BP Location: Right Arm)   Pulse 83   Temp 98.7 F (37.1 C) (Oral)   Resp 14   Ht 5\' 3"  (1.6 m)   Wt 63.5 kg (140 lb)   LMP 06/14/2017 (Approximate)   SpO2 100%   BMI 24.80 kg/m   Physical Exam  Constitutional: She appears well-developed and well-nourished. No distress.  Sitting comfortably in bed.  HENT:  Head: Normocephalic and atraumatic.  Mouth/Throat:    Dental  cavities and poor oral dentition noted. Pain along tooth as depicted in image. No abscess noted. Midline uvula. No trismus. OP clear and moist. No oropharyngeal erythema or edema. Neck supple with no tenderness. No facial edema.   Eyes: Conjunctivae are normal. Right eye exhibits no discharge. Left eye exhibits no discharge.  EOMs normal to gross examination.  Neck: Normal range of motion.  Cardiovascular: Normal rate and regular rhythm.  Intact, 2+ radial pulse.  Pulmonary/Chest:  Normal respiratory effort. Patient converses comfortably. No audible wheeze or stridor.  Abdominal: She exhibits no distension.  Musculoskeletal: Normal  range of motion.  Neurological: She is alert.  Cranial nerves intact to gross observation. Patient moves extremities without difficulty.  Skin: Skin is warm and dry. She is not diaphoretic.  Psychiatric: She has a normal mood and affect. Her behavior is normal. Judgment and thought content normal.  Nursing note and vitals reviewed.    ED Treatments / Results  Labs (all labs ordered are listed, but only abnormal results are displayed) Labs Reviewed - No data to display  EKG  EKG Interpretation None       Radiology No results found.  Procedures Dental Block Date/Time: 07/06/2017 2:51 AM Performed by: Albesa Seen, PA-C Authorized by: Albesa Seen, PA-C   Consent:    Consent obtained:  Verbal   Consent given by:  Patient   Risks discussed:  Unsuccessful block Indications:    Indications: dental pain   Location:    Block type:  Supraperiosteal   Supraperiosteal location:  Lower teeth   Lower teeth location:  19/LL 1st molar Procedure details (see MAR for exact dosages):    Needle gauge:  25 G   Anesthetic injected:  Bupivacaine 0.5% WITH epi Post-procedure details:    Outcome:  Anesthesia achieved   Patient tolerance of procedure:  Tolerated well, no immediate complications   (including critical care time)  Medications Ordered in ED Medications  naproxen (NAPROSYN) tablet 500 mg (500 mg Oral Given 07/06/17 0054)     Initial Impression / Assessment and Plan / ED Course  I have reviewed the triage vital signs and the nursing notes.  Pertinent labs & imaging results that were available during my care of the patient were reviewed by me and considered in my medical decision making (see chart for details).     Final Clinical Impressions(s) / ED Diagnoses   Final diagnoses:  Pain, dental    Renee Roy is a 41 y.o. female who presents to ED for dental pain. No abscess requiring immediate incision and drainage. Patient is afebrile, non toxic appearing,  and swallowing secretions well. Exam not concerning for Ludwig's angina or pharyngeal abscess.  Dental block provided in the emergency department today.  Will treat with Augmentin. I stressed the importance of dental follow up for ultimate management of dental pain, which patient has already established. Patient voices understanding and is agreeable to plan.    Albesa Seen, PA-C 07/06/17 2376    Veryl Speak, MD 07/06/17 [phone]

## 2018-06-23 ENCOUNTER — Other Ambulatory Visit: Payer: Self-pay | Admitting: Family Medicine

## 2018-06-23 ENCOUNTER — Ambulatory Visit
Admission: RE | Admit: 2018-06-23 | Discharge: 2018-06-23 | Disposition: A | Discharge status: Home / Self Care | Payer: Medicaid Other | Source: Ambulatory Visit | Attending: Family Medicine | Admitting: Family Medicine

## 2018-06-23 DIAGNOSIS — R52 Pain, unspecified: Secondary | ICD-10-CM

## 2021-02-13 ENCOUNTER — Emergency Department (HOSPITAL_COMMUNITY)
Admission: EM | Admit: 2021-02-13 | Discharge: 2021-02-13 | Disposition: A | Discharge status: Home / Self Care | Payer: Medicaid Other | Attending: Emergency Medicine | Admitting: Emergency Medicine

## 2021-02-13 DIAGNOSIS — N644 Mastodynia: Secondary | ICD-10-CM | POA: Insufficient documentation

## 2021-02-13 DIAGNOSIS — Z5321 Procedure and treatment not carried out due to patient leaving prior to being seen by health care provider: Secondary | ICD-10-CM | POA: Insufficient documentation

## 2021-02-13 NOTE — ED Notes (Signed)
Patient had not been seen by triage, said she did not want to wait anymore. I advised her to be seen by a doctor but she still wanted to leave

## 2021-03-18 ENCOUNTER — Encounter (HOSPITAL_COMMUNITY): Payer: Self-pay | Admitting: *Deleted

## 2021-03-18 ENCOUNTER — Ambulatory Visit (HOSPITAL_COMMUNITY)
Admission: EM | Admit: 2021-03-18 | Discharge: 2021-03-18 | Disposition: A | Discharge status: Home / Self Care | Payer: Medicaid Other | Attending: Internal Medicine | Admitting: Internal Medicine

## 2021-03-18 ENCOUNTER — Other Ambulatory Visit: Payer: Self-pay

## 2021-03-18 DIAGNOSIS — R1084 Generalized abdominal pain: Secondary | ICD-10-CM

## 2021-03-18 DIAGNOSIS — R0789 Other chest pain: Secondary | ICD-10-CM | POA: Diagnosis not present

## 2021-03-18 LAB — POC URINE PREG, ED: Preg Test, Ur: NEGATIVE

## 2021-03-18 MED ORDER — NAPROXEN 375 MG PO TABS
375.0000 mg | ORAL_TABLET | Freq: Two times a day (BID) | ORAL | 0 refills | Status: AC
Start: 2021-03-18 — End: ?

## 2021-03-18 MED ORDER — TIZANIDINE HCL 4 MG PO TABS: 4.0000 mg | ORAL_TABLET | Freq: Three times a day (TID) | ORAL | 0 refills | Status: DC | PRN

## 2021-03-18 NOTE — ED Triage Notes (Signed)
Pt reports CP and ABD pain started after swimming in pool yesterday. Pt also wants a preg test.

## 2021-03-18 NOTE — ED Provider Notes (Signed)
[place]   MRN: [id number] DOB: 09-18-1975  Subjective:   Renee Roy is a 45 y.o. female presenting for 1 day history of intermittent transient chest pain and abdominal pain.  Symptoms started when patient was at the pool, was swimming at the time and stopped abruptly.  The symptoms lasted a few seconds to minutes and resolved on their own without any intervention.  Today she went to the pool again and the same symptoms happened so she decided to come in for an evaluation.  LMP was in 12/2020, was regular, requests a pregnancy test.  Denies fever, active chest pain or abdominal pain, cough, shortness of breath, wheezing.  She is not currently taking any medications and has no known food or drug allergies.  Denies past medical and surgical history.   History reviewed. No pertinent family history.  Social History   Tobacco Use   Smoking status: Never   Smokeless tobacco: Never  Substance Use Topics   Alcohol use: No   Drug use: No    ROS   Objective:   Vitals: BP (!) 138/92   Pulse 84   Temp 98.4 F (36.9 C)   Resp 18   LMP 12/25/2020 (Approximate)   SpO2 100%   Physical Exam Constitutional:      General: She is not in acute distress.    Appearance: Normal appearance. She is well-developed. She is obese. She is not ill-appearing, toxic-appearing or diaphoretic.  HENT:     Head: Normocephalic and atraumatic.     Nose: Nose normal.     Mouth/Throat:     Mouth: Mucous membranes are moist.     Pharynx: Oropharynx is clear.  Eyes:     General: No scleral icterus.    Extraocular Movements: Extraocular movements intact.     Pupils: Pupils are equal, round, and reactive to light.  Cardiovascular:     Rate and Rhythm: Normal rate and regular rhythm.     Pulses: Normal pulses.     Heart sounds: Normal heart sounds. No murmur heard.   No friction rub. No gallop.  Pulmonary:     Effort: Pulmonary effort is normal. No respiratory distress.     Breath  sounds: Normal breath sounds. No stridor. No wheezing, rhonchi or rales.  Skin:    General: Skin is warm and dry.     Findings: No rash.  Neurological:     General: No focal deficit present.     Mental Status: She is alert and oriented to person, place, and time.  Psychiatric:        Mood and Affect: Mood normal.        Behavior: Behavior normal.        Thought Content: Thought content normal.   ED ECG REPORT   Date: 03/18/2021  Rate: 78bpm  Rhythm: normal sinus rhythm  QRS Axis: normal  Intervals: normal  ST/T Wave abnormalities: normal  Conduction Disutrbances:none  Narrative Interpretation:   Old EKG Reviewed: none available  I have personally reviewed the EKG tracing and agree with the computerized printout as noted.  Results for orders placed or performed during the hospital encounter of 03/18/21 (from the past 24 hour(s))  POC urine pregnancy     Status: None   Collection Time: 03/18/21  3:56 PM  Result Value Ref Range   Preg Test, Ur NEGATIVE NEGATIVE    Assessment and Plan :   PDMP not reviewed this encounter.  1. Atypical chest pain  2. Generalized abdominal pain     Patient has a clear cardiopulmonary exam, has negative EKG and a negative pregnancy test.  She has low risk factors for ACS, chest PE.  She does have a family that she is also very active with.  Counseled that we would manage her symptoms for musculoskeletal pain with naproxen and tizanidine.  She does have a PCP and advised that she follow-up with them soon as possible. Counseled patient on potential for adverse effects with medications prescribed/recommended today, ER and return-to-clinic precautions discussed, patient verbalized understanding.    Jaynee Eagles, [place] 03/18/21 1605

## 2021-04-23 ENCOUNTER — Ambulatory Visit (HOSPITAL_COMMUNITY)
Admission: EM | Admit: 2021-04-23 | Discharge: 2021-04-23 | Disposition: A | Discharge status: Home / Self Care | Payer: Medicaid Other

## 2021-07-07 ENCOUNTER — Encounter (HOSPITAL_COMMUNITY): Payer: Self-pay | Admitting: Emergency Medicine

## 2021-07-07 ENCOUNTER — Emergency Department (HOSPITAL_COMMUNITY)
Admission: EM | Admit: 2021-07-07 | Discharge: 2021-07-07 | Disposition: A | Discharge status: Home / Self Care | Payer: Medicaid Other | Attending: Emergency Medicine | Admitting: Emergency Medicine

## 2021-07-07 ENCOUNTER — Emergency Department (HOSPITAL_COMMUNITY): Payer: Medicaid Other

## 2021-07-07 ENCOUNTER — Other Ambulatory Visit: Payer: Self-pay

## 2021-07-07 DIAGNOSIS — M25561 Pain in right knee: Secondary | ICD-10-CM | POA: Insufficient documentation

## 2021-07-07 NOTE — Discharge Instructions (Signed)
Continue taking your naproxen.  Ice and elevate knee at home. Follow-up with orthopedics if ongoing issues-- can call for appt. Return to the ED for new or worsening symptoms.

## 2021-07-07 NOTE — ED Provider Notes (Signed)
[facility] EMERGENCY DEPARTMENT Provider Note   CSN: [id number] Arrival date & time: 07/07/21  0058     History Chief Complaint  Patient presents with   Knee Pain    Renee Roy is a 45 y.o. female.  The history is provided by the patient and medical records.  Knee Pain  45 y.o. F here with right knee pain and swelling after running 5K Sunday (4 days ago).  States knee feels "tight" when she tries to walk.  No numbness/tingling.  She has been taking naproxen without relief.  History reviewed. No pertinent past medical history.  There are no problems to display for this patient.   History reviewed. No pertinent surgical history.   OB History     Gravida  3   Para  3   Term  3   Preterm  0   AB  0   Living  3      SAB  0   IAB  0   Ectopic  0   Multiple  0   Live Births  3           History reviewed. No pertinent family history.  Social History   Tobacco Use   Smoking status: Never   Smokeless tobacco: Never  Substance Use Topics   Alcohol use: No   Drug use: No    Home Medications Prior to Admission medications   Medication Sig Start Date End Date Taking? Authorizing Provider  bacitracin ointment Apply 1 application topically 2 (two) times daily. 02/06/16   Horton, Barbette Hair, MD  cetirizine (ZYRTEC) 10 MG tablet Take 10 mg by mouth daily as needed for allergies.    [provider]  ibuprofen (ADVIL,MOTRIN) 600 MG tablet Take 1 tablet (600 mg total) by mouth every 6 (six) hours as needed. 06/02/16   Ward, Ozella Almond, PA-C  methocarbamol (ROBAXIN) 500 MG tablet Take 1 tablet (500 mg total) by mouth 2 (two) times daily as needed for muscle spasms. 06/02/16   Ward, Ozella Almond, PA-C  MONONESSA 0.25-35 MG-MCG tablet Take 1 tablet by mouth daily. 02/15/15   [provider]  naproxen (NAPROSYN) 375 MG tablet Take 1 tablet (375 mg total) by mouth 2 (two) times daily. 03/18/21   Jaynee Eagles, PA-C  tiZANidine  (ZANAFLEX) 4 MG tablet Take 1 tablet (4 mg total) by mouth every 8 (eight) hours as needed. 03/18/21   Jaynee Eagles, PA-C  traMADol (ULTRAM) 50 MG tablet Take 1 tablet (50 mg total) by mouth every 6 (six) hours as needed. 04/02/15   [name], MD    Allergies    Patient has no known allergies.  Review of Systems   Review of Systems  Musculoskeletal:  Positive for arthralgias.  All other systems reviewed and are negative.  Physical Exam Updated Vital Signs BP (!) 121/93 (BP Location: Left Arm)   Pulse 71   Temp 98 F (36.7 C)   Resp 16   SpO2 100%   Physical Exam Vitals and nursing note reviewed.  Constitutional:      Appearance: She is well-developed.  HENT:     Head: Normocephalic and atraumatic.  Eyes:     Conjunctiva/sclera: Conjunctivae normal.     Pupils: Pupils are equal, round, and reactive to light.  Cardiovascular:     Rate and Rhythm: Normal rate and regular rhythm.     Heart sounds: Normal heart sounds.  Pulmonary:     Effort: Pulmonary effort is normal.  Breath sounds: Normal breath sounds.  Abdominal:     General: Bowel sounds are normal.     Palpations: Abdomen is soft.  Musculoskeletal:        General: Normal range of motion.     Cervical back: Normal range of motion.     Comments: Right knee with swelling and effusion present, able to flex/extend, remains ambulatory  Skin:    General: Skin is warm and dry.  Neurological:     Mental Status: She is alert and oriented to person, place, and time.    ED Results / Procedures / Treatments   Labs (all labs ordered are listed, but only abnormal results are displayed) Labs Reviewed - No data to display  EKG None  Radiology DG Knee Complete 4 Views Right  Result Date: 07/07/2021 CLINICAL DATA:  Knee pain and swelling. EXAM: RIGHT KNEE - COMPLETE 4+ VIEW COMPARISON:  06/10/2017. FINDINGS: No acute fracture or dislocation. Joint space is maintained. There is a trace suprapatellar joint effusion. The  soft tissues are otherwise within normal limits. IMPRESSION: 1. No acute fracture or dislocation. 2. Trace suprapatellar joint effusion. Electronically Signed   By: Brett Fairy M.D.   On: 07/07/2021 01:56    Procedures Procedures   Medications Ordered in ED Medications - No data to display  ED Course  I have reviewed the triage vital signs and the nursing notes.  Pertinent labs & imaging results that were available during my care of the patient were reviewed by me and considered in my medical decision making (see chart for details).    MDM Rules/Calculators/A&P                           45 y.o. F here with right knee pain after running 5K last weekend.  Does have some mild swelling and effusion noted on exam.  Remains ambulatory.  X-ray negative for bony findings, confirms effusion.  Knee sleeve applied, continue NSAIDs, ice/elevate. Given orthopedic follow-up if needed.  Return here for new concerns.  Final Clinical Impression(s) / ED Diagnoses Final diagnoses:  Acute pain of right knee    Rx / DC Orders ED Discharge Orders     None        Larene Pickett, PA-C 07/07/21 0402    Mesner, Corene Cornea, MD 07/07/21 [phone]

## 2021-07-07 NOTE — ED Triage Notes (Signed)
Patient states she was in a 5K last Sunday, now with right knee swelling and pain.

## 2021-07-07 NOTE — ED Provider Notes (Signed)
Emergency Medicine Provider Triage Evaluation Note  Renee Roy , a 45 y.o. female  was evaluated in triage.  Pt complains of right knee pain after running 5K on Sunday. Has been resting and taking naproxen without relief.  Knee is swollen.  Review of Systems  Positive: Knee pain, swelling Negative: fever  Physical Exam  BP (!) 156/88   Pulse 64   Temp (!) 97.4 F (36.3 C) (Oral)   Resp 16   SpO2 100%  Gen:   Awake, no distress   Resp:  Normal effort  MSK:   Moves extremities without difficulty  Other:  Right knee swollen, obvious effusion  Medical Decision Making  Medically screening exam initiated at 1:26 AM.  Appropriate orders placed.  Vivia Budge was informed that the remainder of the evaluation will be completed by another provider, this initial triage assessment does not replace that evaluation, and the importance of remaining in the ED until their evaluation is complete.  Knee pain.  Effusion present.  X-ray ordered.   Larene Pickett, PA-C 07/07/21 0127    Merrily Pew, MD 07/07/21 [phone]

## 2021-08-02 ENCOUNTER — Other Ambulatory Visit: Payer: Self-pay

## 2021-08-02 ENCOUNTER — Ambulatory Visit: Payer: Medicaid Other | Attending: Orthopedic Surgery

## 2021-08-02 DIAGNOSIS — M6281 Muscle weakness (generalized): Secondary | ICD-10-CM | POA: Insufficient documentation

## 2021-08-02 DIAGNOSIS — M25561 Pain in right knee: Secondary | ICD-10-CM | POA: Insufficient documentation

## 2021-08-02 NOTE — Therapy (Signed)
[place], [place], [address] Phone: [phone]   Fax:  [phone]  Physical Therapy Evaluation  Patient Details  Name: Renee Roy MRN: [id number] Date of Birth: 1976/06/06 Referring Provider (PT): Georgeanna Harrison, MD   Encounter Date: 08/02/2021   PT End of Session - 08/02/21 1219     Visit Number 1    Number of Visits 17    Date for PT Re-Evaluation 09/27/21    Authorization Type [employer]    PT Start Time 1137    PT Stop Time 1220    PT Time Calculation (min) 43 min    Activity Tolerance Patient tolerated treatment well    Behavior During Therapy [facility] for tasks assessed/performed             History reviewed. No pertinent past medical history.  History reviewed. No pertinent surgical history.  There were no vitals filed for this visit.    Subjective Assessment - 08/02/21 1140     Subjective Pt presents to PT with reports of R knee pain after running a 5K on 07/02/21. Pt    How long can you sit comfortably? indefinite    How long can you stand comfortably? indefinite    How long can you walk comfortably? 5 min    Patient Stated Goals pt would like to decrease knee pain in order to get back to walking/running/dancing    Currently in Pain? Yes    Pain Score 0-No pain   6/10 when walking   Pain Location Knee    Pain Orientation Right    Pain Type Acute pain    Pain Onset 1 to 4 weeks ago    Pain Frequency Intermittent    Aggravating Factors  walking, running    Pain Relieving Factors ice, rest           [facility] Adult PT Treatment/Exercise:   Therapeutic Exercise:  Quad set x 10 - 5" Supine SLR x 5 ea S/L clamshell x 10 RTB  Manual Therapy: N/A   Neuromuscular re-ed: N/A   Therapeutic Activity: N/A   Modalities: McConnell taping to R patella with medial glide   Self Care: N/A   Consider / progression for next session:      [facility] PT Assessment - 08/02/21 0001        Assessment   Medical Diagnosis Right Knee Patellafemoral pain syndrome    Referring Provider (PT) Georgeanna Harrison, MD    Onset Date/Surgical Date 07/02/21    Prior Therapy Previous PT for left knee      Precautions   Precautions None      Restrictions   Weight Bearing Restrictions No      Balance Screen   Has the patient fallen in the past 6 months No    Has the patient had a decrease in activity level because of a fear of falling?  No    Is the patient reluctant to leave their home because of a fear of falling?  No      Home Environment   Living Environment Private residence    Living Arrangements Children;Spouse/significant other    Type of [place] Access Level entry      Prior Function   Level of Independence Independent;Independent with basic ADLs      Observation/Other Assessments   Lower Extremity Functional Scale  44/80      Functional Tests   Functional tests Squat;Sit to Stand  Squat   Comments knee strategy; - increased pain with deep squat      Sit to Stand   Comments 30" STS" 7 reps      Palpation   Palpation comment TTP to R patellar tendon, lateral tracking and TTP to vastus lateralis      Special Tests    Special Tests Knee Special Tests    Knee Special tests  Patellofemoral Grind Test (Clarke's Sign)      Patellofemoral Grind test (Clark's Sign)   Findings Postive    Side  Right                        Objective measurements completed on examination: See above findings.                PT Education - 08/02/21 1241     Education Details eval findings, LEFS, HEP, POC    Person(s) Educated Patient    Methods Explanation;Demonstration;Handout    Comprehension Verbalized understanding;Returned demonstration              PT Short Term Goals - 08/02/21 1237       PT SHORT TERM GOAL #1   Title Pt will be knowledgeable and compliant with initial HEP for improved carryover and comfort     Baseline initial HEP given    Time 3    Period Weeks    Status New    Target Date 08/23/21               PT Long Term Goals - 08/02/21 1238       PT LONG TERM GOAL #1   Title Pt will improve LEFS to no less than 65/80 as proxy for functional improvement    Baseline 44/80    Time 8    Period Weeks    Status New    Target Date 09/27/21      PT LONG TERM GOAL #2   Title Pt will self reprot R knee pain no greater than 1-2/10 at worst for improved comfort and function    Baseline 6/10 at worst    Time 8    Period Weeks    Status New    Target Date 09/27/21      PT LONG TERM GOAL #3   Title Pt will improve reps in 30"STS to at least 10 for improved strength and functional mobility    Baseline 7 reps    Time 8    Period Weeks    Status New    Target Date 09/27/21      PT LONG TERM GOAL #4   Title Pt will be able to return to jogging and dancing with no increase in knee pain for return to desired recreational activity    Baseline unable    Time 8    Period Weeks    Status New    Target Date 09/27/21                    Plan - 08/02/21 1230     Clinical Impression Statement Pt is a pleasant 45 y/o F who presents to PT with reports of acute R knee pain after running a 5K on 07/02/21. Physical findings are consistent with MD impression, as pt demonstrates LE weakness and positive Clarke test. She also has palpable patellar tendon tenderness on R LE along with trigger points in R vastus lateralis. Her 30"STS indicates decreased functional mobility and LE  weakness. Likewise, her LEFS score indicates moderate disability and shows she is operating below previously active PLOF, indicating she would benefit from skilled PT services working on improve LE strength, especially quad and hip, in order to decrease pain and improve function.    Examination-Activity Limitations Squat;Stairs;Stand;Locomotion Level;Transfers    Examination-Participation Restrictions Community  Activity;Yard Work    Stability/Clinical Decision Making Stable/Uncomplicated    Designer, jewellery Low    Rehab Potential Excellent    PT Frequency 2x / week    PT Duration 8 weeks    PT Treatment/Interventions ADLs/Self Care Home Management;Electrical Stimulation;Cryotherapy;Moist Heat;Stair training;Gait training;Functional mobility training;Therapeutic activities;Therapeutic exercise;Neuromuscular re-education;Manual techniques;Passive range of motion;Dry needling;Taping;Vasopneumatic Device    PT Next Visit Plan assess response to HEP and taping; progress LE strength as able    PT Home Exercise Plan Access Code: [email or web address]    Consulted and Agree with Plan of Care Patient             Patient will benefit from skilled therapeutic intervention in order to improve the following deficits and impairments:  Decreased activity tolerance, Decreased endurance, Decreased strength, Pain  Visit Diagnosis: Acute pain of right knee - Plan: PT plan of care cert/re-cert  Muscle weakness (generalized) - Plan: PT plan of care cert/re-cert     Problem List There are no problems to display for this patient.   Ward Chatters, PT 08/02/2021, 12:47 PM  [facility] [address] [place], [place], [address] Phone: [phone]   Fax:  [phone]  Name: Renee Roy MRN: [id number] Date of Birth: 29-Jul-1976  Check all possible CPT codes: 45859- Therapeutic Exercise, [phone]- Neuro Re-education, [phone] - Gait Training, [phone] - Manual Therapy, [phone] - Therapeutic Activities, and [id number] - [place]

## 2021-08-14 ENCOUNTER — Other Ambulatory Visit: Payer: Self-pay

## 2021-08-14 ENCOUNTER — Encounter: Payer: Self-pay | Admitting: Physical Therapy

## 2021-08-14 ENCOUNTER — Ambulatory Visit: Payer: Medicaid Other | Admitting: Physical Therapy

## 2021-08-14 DIAGNOSIS — M25561 Pain in right knee: Secondary | ICD-10-CM | POA: Diagnosis not present

## 2021-08-14 DIAGNOSIS — M6281 Muscle weakness (generalized): Secondary | ICD-10-CM

## 2021-08-14 NOTE — Therapy (Signed)
[place], [place], [address] Phone: [phone]   Fax:  [phone]  Physical Therapy Treatment  Patient Details  Name: Renee Roy MRN: [id number] Date of Birth: November 04, 1975 Referring Provider (PT): Georgeanna Harrison, MD   Encounter Date: 08/14/2021   PT End of Session - 08/14/21 1021     Visit Number 2    Number of Visits 17    Date for PT Re-Evaluation 09/27/21    Authorization Type [employer]    PT Start Time 1018    PT Stop Time 1056    PT Time Calculation (min) 38 min             History reviewed. No pertinent past medical history.  History reviewed. No pertinent surgical history.  There were no vitals filed for this visit.   Subjective Assessment - 08/14/21 1021     Subjective Pt reports some pain with night time transitions otherwise she reports her pain is off and on without specific aggravating factors. No pain 3 days after last appointment and then I played soccer with my son which made my pain came back.    Currently in Pain? No/denies    Aggravating Factors  running    Pain Relieving Factors ice,, rest              [facility] Adult PT Treatment/Exercise:   Therapeutic Exercise:  Quad set 2 x 10 - 5" Right Supine SLR x 10 x 2  ea S/L clamshell x 10x 2 each  RTB Bridge with red band clam x 15 - 5 sec hold - added to HEP   Manual Therapy: McConnell ( taping to R patella with medial glide   Neuromuscular re-ed: N/A   Therapeutic Activity: N/A   Modalities: N/A   Self Care: N/A   Consider / progression for next session:               PT Short Term Goals - 08/02/21 1237       PT SHORT TERM GOAL #1   Title Pt will be knowledgeable and compliant with initial HEP for improved carryover and comfort    Baseline initial HEP given    Time 3    Period Weeks    Status New    Target Date 08/23/21               PT Long Term Goals - 08/02/21 1238        PT LONG TERM GOAL #1   Title Pt will improve LEFS to no less than 65/80 as proxy for functional improvement    Baseline 44/80    Time 8    Period Weeks    Status New    Target Date 09/27/21      PT LONG TERM GOAL #2   Title Pt will self reprot R knee pain no greater than 1-2/10 at worst for improved comfort and function    Baseline 6/10 at worst    Time 8    Period Weeks    Status New    Target Date 09/27/21      PT LONG TERM GOAL #3   Title Pt will improve reps in 30"STS to at least 10 for improved strength and functional mobility    Baseline 7 reps    Time 8    Period Weeks    Status New    Target Date 09/27/21      PT LONG TERM GOAL #4  Title Pt will be able to return to jogging and dancing with no increase in knee pain for return to desired recreational activity    Baseline unable    Time 8    Period Weeks    Status New    Target Date 09/27/21                   Plan - 08/14/21 1028     Clinical Impression Statement Pt reports tape and HEP have been helpful with her pain. She reports 3 days of 0/10 pain and then she played soccer with her son which caused her pain to return. Today she has no pain at start of session. Reviewed and progressed HEP with additional hip strength. Reapplied tape as pt thought this was helpful.    PT Treatment/Interventions ADLs/Self Care Home Management;Electrical Stimulation;Cryotherapy;Moist Heat;Stair training;Gait training;Functional mobility training;Therapeutic activities;Therapeutic exercise;Neuromuscular re-education;Manual techniques;Passive range of motion;Dry needling;Taping;Vasopneumatic Device    PT Next Visit Plan assess response to HEP and taping; progress LE strength as able    PT Home Exercise Plan Access Code: [email or web address]             Patient will benefit from skilled therapeutic intervention in order to improve the following deficits and impairments:  Decreased activity tolerance, Decreased endurance,  Decreased strength, Pain  Visit Diagnosis: Acute pain of right knee  Muscle weakness (generalized)     Problem List There are no problems to display for this patient.   Dorene Ar, [place] 08/14/2021, 11:34 AM  [place], [place], [address] Phone: [phone]   Fax:  [phone]  Name: Renee Roy MRN: [id number] Date of Birth: [birth year]

## 2021-08-16 ENCOUNTER — Ambulatory Visit: Payer: Medicaid Other

## 2021-08-16 ENCOUNTER — Other Ambulatory Visit: Payer: Self-pay

## 2021-08-16 DIAGNOSIS — M25561 Pain in right knee: Secondary | ICD-10-CM

## 2021-08-16 DIAGNOSIS — M6281 Muscle weakness (generalized): Secondary | ICD-10-CM

## 2021-08-16 NOTE — Therapy (Signed)
[place], [place], [address] Phone: [phone]   Fax:  [phone]  Physical Therapy Treatment  Patient Details  Name: Renee Roy MRN: [id number] Date of Birth: 1976/01/13 Referring Provider (PT): Georgeanna Harrison, MD   Encounter Date: 08/16/2021   PT End of Session - 08/16/21 1048     Visit Number 3    Number of Visits 17    Date for PT Re-Evaluation 09/27/21    Authorization Type [employer]    PT Start Time 1048    PT Stop Time 1128    PT Time Calculation (min) 40 min    Activity Tolerance Patient tolerated treatment well    Behavior During Therapy [facility] for tasks assessed/performed             No past medical history on file.  No past surgical history on file.  There were no vitals filed for this visit.   Subjective Assessment - 08/16/21 1048     Subjective Pt presents to PT with no current knee pain or discomfort. She has been compliant with HEP with no adverse effect. Pt is ready to begin PT at this time.    Currently in Pain? No/denies    Pain Score 0-No pain           [facility] Adult PT Treatment/Exercise:   Therapeutic Exercise:  Exercise bike lvl 2 x 3 min while taking subjective Quad set x10 - 5"  Supine SLR 3x10 2lb ea S/L hip abd 2x10 ea S/L clamshell 3x10 ea GTB Bridge with red band clam 2x15 GTB STS 3x10 - no UE  LAQ 3x10 5lb                               PT Short Term Goals - 08/02/21 1237       PT SHORT TERM GOAL #1   Title Pt will be knowledgeable and compliant with initial HEP for improved carryover and comfort    Baseline initial HEP given    Time 3    Period Weeks    Status New    Target Date 08/23/21               PT Long Term Goals - 08/02/21 1238       PT LONG TERM GOAL #1   Title Pt will improve LEFS to no less than 65/80 as proxy for functional improvement    Baseline 44/80    Time 8    Period Weeks     Status New    Target Date 09/27/21      PT LONG TERM GOAL #2   Title Pt will self reprot R knee pain no greater than 1-2/10 at worst for improved comfort and function    Baseline 6/10 at worst    Time 8    Period Weeks    Status New    Target Date 09/27/21      PT LONG TERM GOAL #3   Title Pt will improve reps in 30"STS to at least 10 for improved strength and functional mobility    Baseline 7 reps    Time 8    Period Weeks    Status New    Target Date 09/27/21      PT LONG TERM GOAL #4   Title Pt will be able to return to jogging and dancing with no increase in knee pain for return  to desired recreational activity    Baseline unable    Time 8    Period Weeks    Status New    Target Date 09/27/21                   Plan - 08/16/21 1100     Clinical Impression Statement Pt was able to complete all prescribed exercises with no adverse effect or increase in pain. Therapy today continued to focus on improving quad and proximal hip muscle strength bilaterally. Pt demonstrated improving LE strength and functional activity tolerance today and is progressing well overall with therapy. Pt continues to benefit from skilled PT services and will continue to be progressed as tolerated.    PT Treatment/Interventions ADLs/Self Care Home Management;Electrical Stimulation;Cryotherapy;Moist Heat;Stair training;Gait training;Functional mobility training;Therapeutic activities;Therapeutic exercise;Neuromuscular re-education;Manual techniques;Passive range of motion;Dry needling;Taping;Vasopneumatic Device    PT Next Visit Plan assess response to HEP and taping; progress LE strength as able    PT Home Exercise Plan Access Code: [email or web address]             Patient will benefit from skilled therapeutic intervention in order to improve the following deficits and impairments:  Decreased activity tolerance, Decreased endurance, Decreased strength, Pain  Visit Diagnosis: Acute pain of right  knee  Muscle weakness (generalized)     Problem List There are no problems to display for this patient.   Ward Chatters, PT 08/16/2021, 11:29 AM  [facility] [address] [place], [place], [address] Phone: [phone]   Fax:  [phone]  Name: Renee Roy MRN: [id number] Date of Birth: [birth year]

## 2021-08-23 ENCOUNTER — Encounter: Payer: Self-pay | Admitting: Physical Therapy

## 2021-08-23 ENCOUNTER — Ambulatory Visit: Payer: Medicaid Other | Admitting: Physical Therapy

## 2021-08-23 ENCOUNTER — Other Ambulatory Visit: Payer: Self-pay

## 2021-08-23 DIAGNOSIS — M25561 Pain in right knee: Secondary | ICD-10-CM

## 2021-08-23 DIAGNOSIS — M6281 Muscle weakness (generalized): Secondary | ICD-10-CM

## 2021-08-23 NOTE — Therapy (Signed)
[place], [place], [address] Phone: [phone]   Fax:  [phone]  Physical Therapy Treatment  Patient Details  Name: Renee Roy MRN: [id number] Date of Birth: [birth year] Referring Provider (PT): Georgeanna Harrison, MD   Encounter Date: 08/23/2021   PT End of Session - 08/23/21 1034     Visit Number 4    Number of Visits 17    Date for PT Re-Evaluation 09/27/21    Authorization Type [employer]    PT Start Time 1030   15 min late   PT Stop Time 1100    PT Time Calculation (min) 30 min             History reviewed. No pertinent past medical history.  History reviewed. No pertinent surgical history.  There were no vitals filed for this visit.   Subjective Assessment - 08/23/21 1033     Subjective I feel like the exercises are helping a lot. No knee pain today.    Currently in Pain? No/denies             [facility] Adult PT Treatment/Exercise:   Therapeutic Exercise:  Exercise bike lvl 3 x 6 min while taking subjective Knee ext 5# x 10 bilat, x 10 with eccentric focus on right  Knee flex 20# bilat x10, x 10 with eccentric focus on right Horizontal Leg press 2 plates bilat 10 x 2  Supine SLR 3x10 2lb right S/L hip abd 3x10 2 lb right  Bridge with green band clam 3x10 GTB   Not performed today LAQ 3x10 5lb STS 3 x 10 - no UE Quad set x10 - 5"  S/L clamshell 3x10 ea GTB         PT Short Term Goals - 08/23/21 1034       PT SHORT TERM GOAL #1   Title Pt will be knowledgeable and compliant with initial HEP for improved carryover and comfort    Baseline initial HEP given; pt reports compliance with HEP    Time 3    Period Weeks    Status Achieved    Target Date 08/23/21               PT Long Term Goals - 08/02/21 1238       PT LONG TERM GOAL #1   Title Pt will improve LEFS to no less than 65/80 as proxy for functional improvement    Baseline 44/80    Time 8     Period Weeks    Status New    Target Date 09/27/21      PT LONG TERM GOAL #2   Title Pt will self reprot R knee pain no greater than 1-2/10 at worst for improved comfort and function    Baseline 6/10 at worst    Time 8    Period Weeks    Status New    Target Date 09/27/21      PT LONG TERM GOAL #3   Title Pt will improve reps in 30"STS to at least 10 for improved strength and functional mobility    Baseline 7 reps    Time 8    Period Weeks    Status New    Target Date 09/27/21      PT LONG TERM GOAL #4   Title Pt will be able to return to jogging and dancing with no increase in knee pain for return to desired recreational activity    Baseline  unable    Time 8    Period Weeks    Status New    Target Date 09/27/21                   Plan - 08/23/21 1034     Clinical Impression Statement Pt reports improvement in knee pain at home with walking. She has not tried longer distances. She has been moving to a new home and reported no increased pain in her knee with this. Progressed with gym equipment for knee strengthening which she tolerated well. She reported a slight knee pain with leg press which resolved afterward.    PT Treatment/Interventions ADLs/Self Care Home Management;Electrical Stimulation;Cryotherapy;Moist Heat;Stair training;Gait training;Functional mobility training;Therapeutic activities;Therapeutic exercise;Neuromuscular re-education;Manual techniques;Passive range of motion;Dry needling;Taping;Vasopneumatic Device    PT Next Visit Plan assess response to HEP and taping; progress LE strength as able; continue gym machines is tolerated well    PT Home Exercise Plan Access Code: [email or web address]             Patient will benefit from skilled therapeutic intervention in order to improve the following deficits and impairments:  Decreased activity tolerance, Decreased endurance, Decreased strength, Pain  Visit Diagnosis: Acute pain of right knee  Muscle weakness  (generalized)     Problem List There are no problems to display for this patient.   Hessie Diener [place], [place] 08/23/2021, 10:58 AM  [facility] [address] [place], [place], [address] Phone: [phone]   Fax:  [phone]  Name: Renee Roy MRN: [id number] Date of Birth: 08-15-1976

## 2021-08-29 ENCOUNTER — Other Ambulatory Visit: Payer: Self-pay

## 2021-08-29 ENCOUNTER — Ambulatory Visit: Payer: Medicaid Other | Attending: Orthopedic Surgery | Admitting: Physical Therapy

## 2021-08-29 ENCOUNTER — Encounter: Payer: Self-pay | Admitting: Physical Therapy

## 2021-08-29 DIAGNOSIS — M6281 Muscle weakness (generalized): Secondary | ICD-10-CM | POA: Insufficient documentation

## 2021-08-29 DIAGNOSIS — M25561 Pain in right knee: Secondary | ICD-10-CM | POA: Insufficient documentation

## 2021-08-29 NOTE — Therapy (Signed)
[place], [place], [address] Phone: [phone]   Fax:  [phone]  Physical Therapy Treatment  Patient Details  Name: Renee Roy MRN: [id number] Date of Birth: 1975/09/30 Referring Provider (PT): Georgeanna Harrison, MD   Encounter Date: 08/29/2021   PT End of Session - 08/29/21 1040     Visit Number 5    Number of Visits 17    Date for PT Re-Evaluation 09/27/21    Authorization Type [employer], 16 visits    Authorization Time Period 08/14/21-09/30/21    Authorization - Visit Number 4    Authorization - Number of Visits 16    PT Start Time 1035   20 minutes late   PT Stop Time 1059    PT Time Calculation (min) 24 min             History reviewed. No pertinent past medical history.  History reviewed. No pertinent surgical history.  There were no vitals filed for this visit.   Subjective Assessment - 08/29/21 1039     Subjective No knee pain now. The knee is still better. I was just tender from doing the exercise machines last time. No pain with any activities that I can recall over the last week.    Currently in Pain? No/denies             [facility] Adult PT Treatment/Exercise:   Therapeutic Exercise:  Exercise bike lvl 3 x 6 min while taking subjective Knee ext 10# x 10 bilat, x 10 with 1 set eccentric focus on right (reduced to 5#)  Knee flex 25# bilat x10, x 10 with eccentric focus on right (reduced to 20#) Horizontal Leg press 2 plates bilat x 25  Supine SLR 2x10 3lb right S/L hip abd 2x10 3 lb right  Bridge with green band clam 2x10 GTB   Not performed today LAQ 3x10 5lb STS 3 x 10 - no UE Quad set x10 - 5"  S/L clamshell 3x10 ea GTB      PT Short Term Goals - 08/23/21 1034       PT SHORT TERM GOAL #1   Title Pt will be knowledgeable and compliant with initial HEP for improved carryover and comfort    Baseline initial HEP given; pt reports compliance with HEP    Time 3     Period Weeks    Status Achieved    Target Date 08/23/21               PT Long Term Goals - 08/02/21 1238       PT LONG TERM GOAL #1   Title Pt will improve LEFS to no less than 65/80 as proxy for functional improvement    Baseline 44/80    Time 8    Period Weeks    Status New    Target Date 09/27/21      PT LONG TERM GOAL #2   Title Pt will self reprot R knee pain no greater than 1-2/10 at worst for improved comfort and function    Baseline 6/10 at worst    Time 8    Period Weeks    Status New    Target Date 09/27/21      PT LONG TERM GOAL #3   Title Pt will improve reps in 30"STS to at least 10 for improved strength and functional mobility    Baseline 7 reps    Time 8    Period Weeks  Status New    Target Date 09/27/21      PT LONG TERM GOAL #4   Title Pt will be able to return to jogging and dancing with no increase in knee pain for return to desired recreational activity    Baseline unable    Time 8    Period Weeks    Status New    Target Date 09/27/21                   Plan - 08/29/21 1048     Clinical Impression Statement Pt reports continued improvement and no episodes of pain since last visit. She did have sorenes after last session. Discussed finding a place to walk for exercise since she is settled in her new home. Pt was late to session today so focused on repeating gym machnies for knee strengtening.    PT Treatment/Interventions ADLs/Self Care Home Management;Electrical Stimulation;Cryotherapy;Moist Heat;Stair training;Gait training;Functional mobility training;Therapeutic activities;Therapeutic exercise;Neuromuscular re-education;Manual techniques;Passive range of motion;Dry needling;Taping;Vasopneumatic Device    PT Next Visit Plan assess response to HEP and taping; progress LE strength as able; continue gym machines if tolerated well    PT Home Exercise Plan Access Code: [email or web address]    Consulted and Agree with Plan of Care Patient              Patient will benefit from skilled therapeutic intervention in order to improve the following deficits and impairments:  Decreased activity tolerance, Decreased endurance, Decreased strength, Pain  Visit Diagnosis: Muscle weakness (generalized)  Acute pain of right knee     Problem List There are no problems to display for this patient.   Renee Roy [place], [place] 08/29/2021, 10:52 AM  [place], [place], [address] Phone: [phone]   Fax:  [phone]  Name: Renee Roy MRN: [id number] Date of Birth: 09-07-1975

## 2021-08-30 ENCOUNTER — Ambulatory Visit: Payer: Medicaid Other | Admitting: Physical Therapy

## 2021-08-30 ENCOUNTER — Encounter: Payer: Self-pay | Admitting: Physical Therapy

## 2021-08-30 DIAGNOSIS — M6281 Muscle weakness (generalized): Secondary | ICD-10-CM | POA: Diagnosis not present

## 2021-08-30 DIAGNOSIS — M25561 Pain in right knee: Secondary | ICD-10-CM

## 2021-08-30 NOTE — Therapy (Signed)
[place], [place], [address] Phone: [phone]   Fax:  [phone]  Physical Therapy Treatment  Patient Details  Name: Renee Roy MRN: [id number] Date of Birth: 1975/10/05 Referring Provider (PT): Georgeanna Harrison, MD   Encounter Date: 08/30/2021   PT End of Session - 08/30/21 1021     Visit Number 6    Number of Visits 17    Date for PT Re-Evaluation 09/27/21    Authorization Type [employer], 16 visits    Authorization Time Period 08/14/21-09/30/21    Authorization - Visit Number 5    Authorization - Number of Visits 16    PT Start Time 1324    PT Stop Time 1056    PT Time Calculation (min) 38 min             History reviewed. No pertinent past medical history.  History reviewed. No pertinent surgical history.  There were no vitals filed for this visit.   Subjective Assessment - 08/30/21 1021     Subjective No knee pain and was not very sore from yesterday.    Currently in Pain? No/denies             [facility] Adult PT Treatment/Exercise:   Therapeutic Exercise:  Exercise bike lvl 2 x 5 min while taking subjective 6 inch step up with opp knee drive 10 x 2 (without UE ) 4 inch lateral step down 10 x 2 (without UE ) SLS on airex 30 sec right - without UE Knee ext 10# x 10 bilat, x 10 with 1 set eccentric focus on right (reduced to 5#)  Knee flex 25# bilat x10, x 10 with eccentric focus on right (reduced to 20#) Horizontal Leg press 3 plates bilat x 15 , single RLE 1 plate x 10 Supine SLR 2x10 3lb right S/L hip abd 2x10 3 lb right  Bridge with green band clam 2x10 GTB   Not performed today LAQ 3x10 5lb STS 3 x 10 - no UE Quad set x10 - 5"  S/L clamshell 3x10 ea GTB          PT Short Term Goals - 08/23/21 1034       PT SHORT TERM GOAL #1   Title Pt will be knowledgeable and compliant with initial HEP for improved carryover and comfort    Baseline initial HEP given; pt  reports compliance with HEP    Time 3    Period Weeks    Status Achieved    Target Date 08/23/21               PT Long Term Goals - 08/02/21 1238       PT LONG TERM GOAL #1   Title Pt will improve LEFS to no less than 65/80 as proxy for functional improvement    Baseline 44/80    Time 8    Period Weeks    Status New    Target Date 09/27/21      PT LONG TERM GOAL #2   Title Pt will self reprot R knee pain no greater than 1-2/10 at worst for improved comfort and function    Baseline 6/10 at worst    Time 8    Period Weeks    Status New    Target Date 09/27/21      PT LONG TERM GOAL #3   Title Pt will improve reps in 30"STS to at least 10 for improved strength and functional  mobility    Baseline 7 reps    Time 8    Period Weeks    Status New    Target Date 09/27/21      PT LONG TERM GOAL #4   Title Pt will be able to return to jogging and dancing with no increase in knee pain for return to desired recreational activity    Baseline unable    Time 8    Period Weeks    Status New    Target Date 09/27/21                   Plan - 08/30/21 1021     Clinical Impression Statement Pt reports very little soreness after her PT treatment yesterday. Continued with strengthening focus and progressed to closed chain for completion of LTGs. She tolerated therex without c/o pain.    PT Treatment/Interventions ADLs/Self Care Home Management;Electrical Stimulation;Cryotherapy;Moist Heat;Stair training;Gait training;Functional mobility training;Therapeutic activities;Therapeutic exercise;Neuromuscular re-education;Manual techniques;Passive range of motion;Dry needling;Taping;Vasopneumatic Device    PT Next Visit Plan assess response to HEP and taping; progress LE strength as able; continue closed chain  if tolerated well    PT Home Exercise Plan Access Code: [email or web address]             Patient will benefit from skilled therapeutic intervention in order to improve the  following deficits and impairments:  Decreased activity tolerance, Decreased endurance, Decreased strength, Pain  Visit Diagnosis: Muscle weakness (generalized)  Acute pain of right knee     Problem List There are no problems to display for this patient.   Hessie Diener [place], [place] 08/30/2021, 10:45 AM  [facility] [address] [place], [place], [address] Phone: [phone]   Fax:  [phone]  Name: Zelene Barga MRN: [id number] Date of Birth: October 26, 1975

## 2021-09-04 ENCOUNTER — Encounter: Payer: Medicaid Other | Admitting: Physical Therapy

## 2021-09-05 ENCOUNTER — Ambulatory Visit: Payer: Medicaid Other

## 2021-09-05 ENCOUNTER — Other Ambulatory Visit: Payer: Self-pay

## 2021-09-05 DIAGNOSIS — M6281 Muscle weakness (generalized): Secondary | ICD-10-CM | POA: Diagnosis not present

## 2021-09-05 DIAGNOSIS — M25561 Pain in right knee: Secondary | ICD-10-CM

## 2021-09-05 NOTE — Therapy (Signed)
[place], [place], [address] Phone: [phone]   Fax:  [phone]  Physical Therapy Treatment  Patient Details  Name: Renu Asby MRN: [id number] Date of Birth: [birth year] Referring Provider (PT): Georgeanna Harrison, MD   Encounter Date: 09/05/2021   PT End of Session - 09/05/21 1217     Visit Number 7    Number of Visits 17    Date for PT Re-Evaluation 09/27/21    Authorization Type [employer], 16 visits    Authorization Time Period 08/14/21-09/30/21    Authorization - Visit Number 7    Authorization - Number of Visits 16    PT Start Time 1217    PT Stop Time 1256    PT Time Calculation (min) 39 min    Activity Tolerance Patient tolerated treatment well    Behavior During Therapy [facility] for tasks assessed/performed             No past medical history on file.  No past surgical history on file.  There were no vitals filed for this visit.   Subjective Assessment - 09/05/21 1218     Subjective Pt presents to PT with reports of slight R knee pain. She has been fairly compliant with HEP with no adverse effect. Pt is ready to begin PT treatment at this time.    Currently in Pain? Yes    Pain Score 6     Pain Location Knee    Pain Orientation Right           [facility] Adult PT Treatment/Exercise:   Therapeutic Exercise:  Exercise bike lvl 2 x 5 min while taking subjective 8 inch step up with opp knee drive 8X21 (without UE ) Eccentric heel tap 2x10 4in ea Lateral walk x 2 laps in // RTB Standing hip abd/ext 2x10 RTB SLS on airex 2x30" - without UE Knee ext 2x10 - 10#  Knee flex 2x8 30# Wall squat x 10 - 3" hold Repeated lunges 2x10 to 8in step  Not performed today Horizontal Leg press 3 plates bilat x 15 , single RLE 1 plate x 10 Supine SLR 2x10 3lb right S/L hip abd 2x10 3 lb right  Bridge with green band clam 2x10 GTB LAQ 3x10 5lb STS 3 x 10 - no UE Quad set x10 - 5"  S/L  clamshell 3x10 ea GTB                               PT Short Term Goals - 08/23/21 1034       PT SHORT TERM GOAL #1   Title Pt will be knowledgeable and compliant with initial HEP for improved carryover and comfort    Baseline initial HEP given; pt reports compliance with HEP    Time 3    Period Weeks    Status Achieved    Target Date 08/23/21               PT Long Term Goals - 08/02/21 1238       PT LONG TERM GOAL #1   Title Pt will improve LEFS to no less than 65/80 as proxy for functional improvement    Baseline 44/80    Time 8    Period Weeks    Status New    Target Date 09/27/21      PT LONG TERM GOAL #2   Title Pt will self reprot  R knee pain no greater than 1-2/10 at worst for improved comfort and function    Baseline 6/10 at worst    Time 8    Period Weeks    Status New    Target Date 09/27/21      PT LONG TERM GOAL #3   Title Pt will improve reps in 30"STS to at least 10 for improved strength and functional mobility    Baseline 7 reps    Time 8    Period Weeks    Status New    Target Date 09/27/21      PT LONG TERM GOAL #4   Title Pt will be able to return to jogging and dancing with no increase in knee pain for return to desired recreational activity    Baseline unable    Time 8    Period Weeks    Status New    Target Date 09/27/21                   Plan - 09/05/21 1258     Clinical Impression Statement Pt was able to complete prescribed exercises wtih no adverse effect. Therapy continues to progress quad and proximal hip strengthening, with pt continuing to improve and tolerate higher difficulty exercises. PT will continue to progress exercises as tolerated per POC as prescribed.    PT Treatment/Interventions ADLs/Self Care Home Management;Electrical Stimulation;Cryotherapy;Moist Heat;Stair training;Gait training;Functional mobility training;Therapeutic activities;Therapeutic exercise;Neuromuscular  re-education;Manual techniques;Passive range of motion;Dry needling;Taping;Vasopneumatic Device    PT Next Visit Plan assess response to HEP and taping; progress LE strength as able; continue closed chain  if tolerated well    PT Home Exercise Plan Access Code: [email or web address]             Patient will benefit from skilled therapeutic intervention in order to improve the following deficits and impairments:  Decreased activity tolerance, Decreased endurance, Decreased strength, Pain  Visit Diagnosis: Muscle weakness (generalized)  Acute pain of right knee     Problem List There are no problems to display for this patient.   Ward Chatters, PT 09/05/2021, 12:59 PM  [facility] [address] [place], [place], [address] Phone: [phone]   Fax:  [phone]  Name: Ilani Otterson MRN: [id number] Date of Birth: 23-Oct-1975

## 2021-09-06 ENCOUNTER — Encounter: Payer: Medicaid Other | Admitting: Physical Therapy

## 2021-09-07 ENCOUNTER — Ambulatory Visit: Payer: Medicaid Other

## 2021-09-07 ENCOUNTER — Other Ambulatory Visit: Payer: Self-pay

## 2021-09-07 DIAGNOSIS — M25561 Pain in right knee: Secondary | ICD-10-CM

## 2021-09-07 DIAGNOSIS — M6281 Muscle weakness (generalized): Secondary | ICD-10-CM

## 2021-09-07 NOTE — Therapy (Signed)
[place], [place], [address] Phone: [phone]   Fax:  [phone]  Physical Therapy Treatment  Patient Details  Name: Renee Roy MRN: [id number] Date of Birth: 12/09/1975 Referring Provider (PT): Georgeanna Harrison, MD   Encounter Date: 09/07/2021   PT End of Session - 09/07/21 1403     Visit Number 8    Number of Visits 17    Date for PT Re-Evaluation 09/27/21    Authorization Type [employer], 16 visits    Authorization Time Period 08/14/21-09/30/21    Authorization - Visit Number 8    Authorization - Number of Visits 16    PT Start Time 6433    PT Stop Time 1442    PT Time Calculation (min) 39 min    Activity Tolerance Patient tolerated treatment well    Behavior During Therapy [facility] for tasks assessed/performed             No past medical history on file.  No past surgical history on file.  There were no vitals filed for this visit.   Subjective Assessment - 09/07/21 1403     Subjective Pt presents to PT with no current R knee pain. Has contninued compliance with her HEP with no adverse effect. She is ready to begin PT treatment at this time.    Currently in Pain? No/denies    Pain Score 0-No pain           [facility] Adult PT Treatment/Exercise:   Therapeutic Exercise:  Exercise bike lvl 3 x 3 min while taking subjective 8 inch step up with opp knee drive 2R51 (without UE ) Eccentric heel tap 2x10 4in ea Lateral walk x 2 laps in // RTB Standing hip abd/ext 2x10 RTB Knee ext 2x10 - 10#  Knee flex 2x10 35# Leg press 3x10 55# Hip abd machine 2x10 30# Wall squat 2x10 - 3" hold Repeated lunges 2x10 to 8in step   Not performed today Horizontal Leg press 3 plates bilat x 15 , single RLE 1 plate x 10 Supine SLR 2x10 3lb right S/L hip abd 2x10 3 lb right  Bridge with green band clam 2x10 GTB LAQ 3x10 5lb STS 3 x 10 - no UE Quad set x10 - 5"  S/L clamshell 3x10 ea GTB SLS on airex  2x30" - without UE                             PT Education - 09/07/21 1436     Education Details HEP update    Person(s) Educated Patient    Methods Explanation;Demonstration;Handout    Comprehension Verbalized understanding;Returned demonstration              PT Short Term Goals - 08/23/21 1034       PT SHORT TERM GOAL #1   Title Pt will be knowledgeable and compliant with initial HEP for improved carryover and comfort    Baseline initial HEP given; pt reports compliance with HEP    Time 3    Period Weeks    Status Achieved    Target Date 08/23/21               PT Long Term Goals - 08/02/21 1238       PT LONG TERM GOAL #1   Title Pt will improve LEFS to no less than 65/80 as proxy for functional improvement    Baseline 44/80  Time 8    Period Weeks    Status New    Target Date 09/27/21      PT LONG TERM GOAL #2   Title Pt will self reprot R knee pain no greater than 1-2/10 at worst for improved comfort and function    Baseline 6/10 at worst    Time 8    Period Weeks    Status New    Target Date 09/27/21      PT LONG TERM GOAL #3   Title Pt will improve reps in 30"STS to at least 10 for improved strength and functional mobility    Baseline 7 reps    Time 8    Period Weeks    Status New    Target Date 09/27/21      PT LONG TERM GOAL #4   Title Pt will be able to return to jogging and dancing with no increase in knee pain for return to desired recreational activity    Baseline unable    Time 8    Period Weeks    Status New    Target Date 09/27/21                   Plan - 09/07/21 1414     Clinical Impression Statement Pt was able to complete all prescribed exercises with no adverse effect or increase in pain. Therapy today continued to focus on improving proximal hip and quad strength in order to decrease R knee pain. Pt continues ot progress as expected with therapy, with HEP updated for continued  strengthening. Will continue to progress as tolerated per POC.    PT Treatment/Interventions ADLs/Self Care Home Management;Electrical Stimulation;Cryotherapy;Moist Heat;Stair training;Gait training;Functional mobility training;Therapeutic activities;Therapeutic exercise;Neuromuscular re-education;Manual techniques;Passive range of motion;Dry needling;Taping;Vasopneumatic Device    PT Next Visit Plan assess response to HEP and taping; progress LE strength as able; continue closed chain  if tolerated well    PT Home Exercise Plan Access Code: [email or web address]             Patient will benefit from skilled therapeutic intervention in order to improve the following deficits and impairments:  Decreased activity tolerance, Decreased endurance, Decreased strength, Pain  Visit Diagnosis: Muscle weakness (generalized)  Acute pain of right knee     Problem List There are no problems to display for this patient.   Ward Chatters, PT 09/07/2021, 2:44 PM  [facility] [address] [place], [place], [address] Phone: [phone]   Fax:  [phone]  Name: Renee Roy MRN: [id number] Date of Birth: 07-15-1976

## 2021-09-12 ENCOUNTER — Ambulatory Visit: Payer: Medicaid Other

## 2021-09-12 ENCOUNTER — Other Ambulatory Visit: Payer: Self-pay

## 2021-09-12 DIAGNOSIS — M25561 Pain in right knee: Secondary | ICD-10-CM

## 2021-09-12 DIAGNOSIS — M6281 Muscle weakness (generalized): Secondary | ICD-10-CM

## 2021-09-12 NOTE — Therapy (Signed)
[place], [place], [address] Phone: [phone]   Fax:  [phone]  Physical Therapy Treatment  Patient Details  Name: Renee Roy MRN: [id number] Date of Birth: [birth year] Referring Provider (PT): Georgeanna Harrison, MD   Encounter Date: 09/12/2021   PT End of Session - 09/12/21 1218     Visit Number 9    Number of Visits 17    Date for PT Re-Evaluation 09/27/21    Authorization Type [employer], 16 visits    Authorization Time Period 08/14/21-09/30/21    Authorization - Visit Number 9    Authorization - Number of Visits 16    PT Start Time 1218    PT Stop Time 7408    PT Time Calculation (min) 40 min    Activity Tolerance Patient tolerated treatment well    Behavior During Therapy [facility] for tasks assessed/performed             History reviewed. No pertinent past medical history.  History reviewed. No pertinent surgical history.  There were no vitals filed for this visit.   Subjective Assessment - 09/12/21 1218     Subjective Pt presents to PT with no current knee pain. Has been compliant with HEP with no adverse effect. Pt is ready to begin PT treatment at this time,    Currently in Pain? No/denies    Pain Score 0-No pain           [facility] Adult PT Treatment/Exercise:   Therapeutic Exercise:  Exercise bike lvl 3 x 3.5 min while taking subjective 8 inch step up with opp knee drive 1K48 (without UE) Eccentric heel tap 2x10 4in ea Lateral walk x 3 laps in // RTB Knee ext 2x10 - 10#  Knee ext 2x10 5# R only Knee flex 2x10 35# Leg press 3x10 55# Hip abd machine 2x10 30# Wall squat 2x10 - 3" hold Repeated lunges 2x10 to 8in step (NT)                                 PT Short Term Goals - 08/23/21 1034       PT SHORT TERM GOAL #1   Title Pt will be knowledgeable and compliant with initial HEP for improved carryover and comfort    Baseline initial HEP given;  pt reports compliance with HEP    Time 3    Period Weeks    Status Achieved    Target Date 08/23/21               PT Long Term Goals - 08/02/21 1238       PT LONG TERM GOAL #1   Title Pt will improve LEFS to no less than 65/80 as proxy for functional improvement    Baseline 44/80    Time 8    Period Weeks    Status New    Target Date 09/27/21      PT LONG TERM GOAL #2   Title Pt will self reprot R knee pain no greater than 1-2/10 at worst for improved comfort and function    Baseline 6/10 at worst    Time 8    Period Weeks    Status New    Target Date 09/27/21      PT LONG TERM GOAL #3   Title Pt will improve reps in 30"STS to at least 10 for improved strength and functional mobility  Baseline 7 reps    Time 8    Period Weeks    Status New    Target Date 09/27/21      PT LONG TERM GOAL #4   Title Pt will be able to return to jogging and dancing with no increase in knee pain for return to desired recreational activity    Baseline unable    Time 8    Period Weeks    Status New    Target Date 09/27/21                   Plan - 09/12/21 1259     Clinical Impression Statement Pt was able to complete all prescribed exercises with no adverse effect. Therapy today continued to focus on improving eccentric quad and proximal hip strenth. She continues to progress well with therapy and may be ready for discharge at next session. Will otherwise continue with POC as prescribed    PT Treatment/Interventions ADLs/Self Care Home Management;Electrical Stimulation;Cryotherapy;Moist Heat;Stair training;Gait training;Functional mobility training;Therapeutic activities;Therapeutic exercise;Neuromuscular re-education;Manual techniques;Passive range of motion;Dry needling;Taping;Vasopneumatic Device    PT Next Visit Plan assess goals and discharge readiness    PT Home Exercise Plan Access Code: [email or web address]             Patient will benefit from skilled therapeutic  intervention in order to improve the following deficits and impairments:  Decreased activity tolerance, Decreased endurance, Decreased strength, Pain  Visit Diagnosis: Muscle weakness (generalized)  Acute pain of right knee     Problem List There are no problems to display for this patient.   Ward Chatters, PT 09/12/2021, 1:01 PM  [facility] [address] [place], [place], [address] Phone: [phone]   Fax:  [phone]  Name: Renee Roy MRN: [id number] Date of Birth: 1975/10/18

## 2021-09-13 ENCOUNTER — Encounter: Payer: Medicaid Other | Admitting: Physical Therapy

## 2021-09-14 ENCOUNTER — Other Ambulatory Visit: Payer: Self-pay

## 2021-09-14 ENCOUNTER — Ambulatory Visit: Payer: Medicaid Other

## 2021-09-14 DIAGNOSIS — M6281 Muscle weakness (generalized): Secondary | ICD-10-CM | POA: Diagnosis not present

## 2021-09-14 DIAGNOSIS — M25561 Pain in right knee: Secondary | ICD-10-CM

## 2021-09-14 NOTE — Therapy (Signed)
[place], [place], [address] Phone: [phone]   Fax:  [phone]  Physical Therapy Treatment/Discharge  Patient Details  Name: Renee Roy MRN: [id number] Date of Birth: [birth year] Referring Provider (PT): Georgeanna Harrison, MD   Encounter Date: 09/14/2021   PT End of Session - 09/14/21 1404     Visit Number 10    Number of Visits 17    Date for PT Re-Evaluation 09/27/21    Authorization Type [employer], 16 visits    Authorization Time Period 08/14/21-09/30/21    Authorization - Visit Number 10    Authorization - Number of Visits 16    PT Start Time 6803    PT Stop Time 1442    PT Time Calculation (min) 38 min    Activity Tolerance Patient tolerated treatment well    Behavior During Therapy [facility] for tasks assessed/performed             History reviewed. No pertinent past medical history.  History reviewed. No pertinent surgical history.  There were no vitals filed for this visit.   Subjective Assessment - 09/14/21 1410     Subjective Pt presents to PT with no current knee pain. Has been compliant with HEP with no adverse effect. Pt is ready to begin PT treatment at this time,    Currently in Pain? No/denies    Pain Score 0-No pain            [facility] Adult PT Treatment/Exercise: Therapeutic Exercise:  Exercise bike lvl 3 x 3 min while taking subjective Supine SLR 3x15 ea Bridge with GTB 3x10 - 3" S/L clamshell 2x15 GTB ea Eccentric heel tap 2x10 4in ea Lateral walk x 3 laps in // RTB Standing hip abd/ext x 15 RTB ea Wall squat 2x10 Therapeutic Activity: Assessment of tests/measures, goals, and outcomes for purposes of discharge     [facility] PT Assessment - 09/14/21 0001       Observation/Other Assessments   Lower Extremity Functional Scale  75/80      Sit to Stand   Comments 30" STS: 11 reps                                    PT Education -  09/14/21 1422     Education Details HEP and discharge plan    Person(s) Educated Patient    Methods Explanation;Demonstration;Handout    Comprehension Verbalized understanding;Returned demonstration              PT Short Term Goals - 08/23/21 1034       PT SHORT TERM GOAL #1   Title Pt will be knowledgeable and compliant with initial HEP for improved carryover and comfort    Baseline initial HEP given; pt reports compliance with HEP    Time 3    Period Weeks    Status Achieved    Target Date 08/23/21               PT Long Term Goals - 09/14/21 1409       PT LONG TERM GOAL #1   Title Pt will improve LEFS to no less than 65/80 as proxy for functional improvement    Baseline 44/80    Time 8    Period Weeks    Status Achieved    Target Date 09/27/21      PT LONG TERM GOAL #2  Title Pt will self reprot R knee pain no greater than 1-2/10 at worst for improved comfort and function    Baseline 6/10 at worst    Time 8    Period Weeks    Status Achieved    Target Date 09/27/21      PT LONG TERM GOAL #3   Title Pt will improve reps in 30"STS to at least 10 for improved strength and functional mobility    Baseline 7 reps    Time 8    Period Weeks    Status Achieved    Target Date 09/27/21      PT LONG TERM GOAL #4   Title Pt will be able to return to jogging and dancing with no increase in knee pain for return to desired recreational activity    Baseline unable    Time 8    Period Weeks    Status Achieved    Target Date 09/27/21                   Plan - 09/14/21 1417     Clinical Impression Statement Pt was able to complete all prescribed exercises and demonstrated knowledge of HEP with on adverse effect. Over the course of PT treatment, she has progressed very well with therapy, reducing her R knee pain to 0/10 and meeting all other LTGs. She demonstrates improved subjective functional ability with large improvement in LEFS score, and has also  returned to jogging with no adverse effect. She should continue to improve with HEP compliance and is being discharged at this time with HEP in place.    PT Treatment/Interventions ADLs/Self Care Home Management;Electrical Stimulation;Cryotherapy;Moist Heat;Stair training;Gait training;Functional mobility training;Therapeutic activities;Therapeutic exercise;Neuromuscular re-education;Manual techniques;Passive range of motion;Dry needling;Taping;Vasopneumatic Device    PT Home Exercise Plan Access Code: [email or web address]    Consulted and Agree with Plan of Care Patient             Patient will benefit from skilled therapeutic intervention in order to improve the following deficits and impairments:  Decreased activity tolerance, Decreased endurance, Decreased strength, Pain  Visit Diagnosis: Muscle weakness (generalized)  Acute pain of right knee     Problem List There are no problems to display for this patient.   Ward Chatters, PT 09/14/2021, 2:43 PM  [facility] [address] [place], [place], [address] Phone: [phone]   Fax:  [phone]  Name: Renee Roy MRN: [id number] Date of Birth: July 22, 1976  PHYSICAL THERAPY DISCHARGE SUMMARY  Visits from Start of Care: 10  Current functional level related to goals / functional outcomes: See goals and objective   Remaining deficits: None   Education / Equipment: HEP   Patient agrees to discharge. Patient goals were met. Patient is being discharged due to meeting the stated rehab goals.

## 2021-11-07 ENCOUNTER — Encounter (HOSPITAL_BASED_OUTPATIENT_CLINIC_OR_DEPARTMENT_OTHER): Payer: Self-pay | Admitting: Obstetrics and Gynecology

## 2021-11-08 ENCOUNTER — Inpatient Hospital Stay (HOSPITAL_COMMUNITY): Admission: RE | Admit: 2021-11-08 | Payer: Medicaid Other | Source: Ambulatory Visit

## 2021-11-08 ENCOUNTER — Other Ambulatory Visit (HOSPITAL_COMMUNITY): Payer: Medicaid Other

## 2021-11-10 ENCOUNTER — Other Ambulatory Visit: Payer: Self-pay

## 2021-11-10 ENCOUNTER — Encounter (HOSPITAL_COMMUNITY): Payer: Self-pay | Admitting: *Deleted

## 2021-11-10 NOTE — Progress Notes (Signed)
Spoke w/ via phone for pre-op interview--- pt ?Lab needs dos----  cbc, bmp, t&s, urine preg             ?Lab results------ no ?COVID test -----patient states asymptomatic no test needed ?Arrive at -------  0530 on 11-10-2021 ?NPO after MN NO Solid Food.  Clear liquids from MN until--- 0430 ?Med rec completed ?Medications to take morning of surgery ----- none ?Diabetic medication ----- n/a ?Patient instructed no nail polish to be worn day of surgery ?Patient instructed to bring photo id and insurance card day of surgery ?Patient aware to have Driver (ride ) / caregiver for 24 hours after surgery ---husband, tahir bashir ?Patient Special Instructions ----- n/a ?Pre-Op special Istructions ----- n/a ?Patient verbalized understanding of instructions that were given at this phone interview. ?Patient denies shortness of breath, chest pain, fever, cough at this phone interview.  ?

## 2021-11-11 NOTE — Anesthesia Preprocedure Evaluation (Addendum)
Anesthesia Evaluation  ?Patient identified by MRN, date of birth, ID band ?Patient awake ? ? ? ?Reviewed: ?Allergy & Precautions, NPO status , Patient's Chart, lab work & pertinent test results ? ?History of Anesthesia Complications ?Negative for: history of anesthetic complications ? ?Airway ?Mallampati: I ? ?TM Distance: >3 FB ?Neck ROM: Full ? ? ? Dental ? ?(+) Dental Advisory Given, Missing ?  ?Pulmonary ?asthma , COPD,  COPD inhaler,  ?  ?breath sounds clear to auscultation ? ? ? ? ? ? Cardiovascular ?negative cardio ROS ? ? ?Rhythm:Regular Rate:Normal ? ? ?  ?Neuro/Psych ?glaucoma ?  ? GI/Hepatic ?negative GI ROS, Neg liver ROS,   ?Endo/Other  ?negative endocrine ROS ? Renal/GU ?negative Renal ROS  ? ?  ?Musculoskeletal ? ? Abdominal ?  ?Peds ? Hematology ? ?(+) Blood dyscrasia, anemia ,   ?Anesthesia Other Findings ? ? Reproductive/Obstetrics ? ?  ? ? ? ? ? ? ? ? ? ? ? ? ? ?  ?  ? ? ? ? ? ? ?Anesthesia Physical ?Anesthesia Plan ? ?ASA: 2 ? ?Anesthesia Plan: General  ? ?Post-op Pain Management: Tylenol PO (pre-op)*  ? ?Induction: Intravenous ? ?PONV Risk Score and Plan: 3 and Ondansetron and Dexamethasone ? ?Airway Management Planned: LMA ? ?Additional Equipment: None ? ?Intra-op Plan:  ? ?Post-operative Plan:  ? ?Informed Consent: I have reviewed the patients History and Physical, chart, labs and discussed the procedure including the risks, benefits and alternatives for the proposed anesthesia with the patient or authorized representative who has indicated his/her understanding and acceptance.  ? ? ? ?Dental advisory given ? ?Plan Discussed with: CRNA and Surgeon ? ?Anesthesia Plan Comments:   ? ? ? ? ?Anesthesia Quick Evaluation ? ?

## 2021-11-12 NOTE — H&P (Signed)
Renee Roy is an 46 y.o. female G3P3 presenting for hysteroscopic resection with myosure reach of a large submucosal fibroid. ?The pt d/c OCP's in June 2022 in hopes of conceiving and noted she had daily spotting and irregular bleeding.  Subsequent US revealed a large submucosal fibroid about 5cm and FSH level was c/w early menopause.  ?Pt reports she is still having abnormal bleeding. She has been bleeding at menstrual time every 28 days and then some BTB in between. Flow can be clotty but not too heavy. SHe denies hot flashes or other symptoms.  ? ?Pertinent Gynecological History: ? ?OB HistoryNSVD x 3  ? ?Menstrual History: ? ?No LMP recorded. (Menstrual status: Irregular Periods). ?  ? ?Past Medical History:  ?Diagnosis Date  ? Abnormal uterine bleeding (AUB)   ? Allergic rhinitis   ? Arthritis   ? chronic knee pain  bilateral  ? Chronic conjunctivitis   ? Environmental allergies   ? Glaucoma, both eyes   ? History of gestational diabetes   ? IDA (iron deficiency anemia)   ? Uterine fibroid   ? Wears glasses   ? ? ?Past Surgical History:  ?Procedure Laterality Date  ? NO PAST SURGERIES    ? ? ?History reviewed. No pertinent family history. ? ?Social History:  reports that she has never smoked. She has never used smokeless tobacco. She reports that she does not drink alcohol and does not use drugs. ? ?Allergies: No Known Allergies ? ?No medications prior to admission.  ? ? ?Review of Systems  ?Constitutional:  Negative for fever.  ?Gastrointestinal:  Negative for abdominal pain.  ?Genitourinary:  Positive for menstrual problem and pelvic pain.  ? ?Height '5\' 3"'$  (1.6 m), weight 65.3 kg. ?Physical Exam ?Cardiovascular:  ?   Rate and Rhythm: Normal rate and regular rhythm.  ?Pulmonary:  ?   Effort: Pulmonary effort is normal.  ?Abdominal:  ?   Palpations: Abdomen is soft.  ?Genitourinary: ?   General: Normal vulva.  ?   Comments: Uterus ULN in size ?Neurological:  ?   Mental Status: She is alert.  ?Psychiatric:     ?    Mood and Affect: Mood normal.  ? ? ?No results found for this or any previous visit (from the past 24 hour(s)). ? ?No results found. ? ?Assessment/Plan: ?The patient was counseled regarding the hysteroscopy procedure in detail.  We reviewed risks of bleeding and infection and possible uterine perforation.  We also discussed specifically the removal of her large submucosal fibroids and what that would involve.  The patient desires to proceed.  She will use cytotec 459mg 3 hours prior to the procedure to aid with cervical dilation. We did discuss that this would not .aid in getting her pregnant but would hopefully help the abnormal bleeding.  ? ?KLogan Bores?11/12/2021, 12:54 PM ? ?

## 2021-11-13 ENCOUNTER — Encounter (HOSPITAL_BASED_OUTPATIENT_CLINIC_OR_DEPARTMENT_OTHER): Payer: Self-pay | Admitting: Obstetrics and Gynecology

## 2021-11-13 ENCOUNTER — Other Ambulatory Visit: Payer: Self-pay

## 2021-11-13 ENCOUNTER — Ambulatory Visit (HOSPITAL_BASED_OUTPATIENT_CLINIC_OR_DEPARTMENT_OTHER): Payer: Medicaid Other | Admitting: Anesthesiology

## 2021-11-13 ENCOUNTER — Encounter (HOSPITAL_BASED_OUTPATIENT_CLINIC_OR_DEPARTMENT_OTHER)
Admission: RE | Disposition: A | Discharge status: Home / Self Care | Payer: Self-pay | Source: Home / Self Care | Attending: Obstetrics and Gynecology

## 2021-11-13 ENCOUNTER — Ambulatory Visit (HOSPITAL_BASED_OUTPATIENT_CLINIC_OR_DEPARTMENT_OTHER)
Admission: RE | Admit: 2021-11-13 | Discharge: 2021-11-13 | Disposition: A | Discharge status: Home / Self Care | Payer: Medicaid Other | Attending: Obstetrics and Gynecology | Admitting: Obstetrics and Gynecology

## 2021-11-13 DIAGNOSIS — N858 Other specified noninflammatory disorders of uterus: Secondary | ICD-10-CM

## 2021-11-13 DIAGNOSIS — H409 Unspecified glaucoma: Secondary | ICD-10-CM | POA: Insufficient documentation

## 2021-11-13 DIAGNOSIS — D250 Submucous leiomyoma of uterus: Secondary | ICD-10-CM | POA: Insufficient documentation

## 2021-11-13 DIAGNOSIS — N939 Abnormal uterine and vaginal bleeding, unspecified: Secondary | ICD-10-CM | POA: Diagnosis present

## 2021-11-13 DIAGNOSIS — J449 Chronic obstructive pulmonary disease, unspecified: Secondary | ICD-10-CM | POA: Diagnosis not present

## 2021-11-13 HISTORY — DX: Abnormal uterine and vaginal bleeding, unspecified: N93.9

## 2021-11-13 HISTORY — PX: DILATATION & CURETTAGE/HYSTEROSCOPY WITH MYOSURE: SHX6511

## 2021-11-13 HISTORY — DX: Allergic rhinitis, unspecified: J30.9

## 2021-11-13 HISTORY — DX: Personal history of gestational diabetes: Z86.32

## 2021-11-13 HISTORY — DX: Presence of spectacles and contact lenses: Z97.3

## 2021-11-13 HISTORY — DX: Leiomyoma of uterus, unspecified: D25.9

## 2021-11-13 HISTORY — DX: Other allergy status, other than to drugs and biological substances: Z91.09

## 2021-11-13 HISTORY — DX: Unspecified chronic conjunctivitis, unspecified eye: H10.409

## 2021-11-13 HISTORY — DX: Iron deficiency anemia, unspecified: D50.9

## 2021-11-13 HISTORY — DX: Unspecified glaucoma: H40.9

## 2021-11-13 HISTORY — DX: Unspecified osteoarthritis, unspecified site: M19.90

## 2021-11-13 LAB — POCT PREGNANCY, URINE: Preg Test, Ur: NEGATIVE

## 2021-11-13 LAB — BASIC METABOLIC PANEL
Anion gap: 7 (ref 5–15)
BUN: 13 mg/dL (ref 6–20)
CO2: 27 mmol/L (ref 22–32)
Calcium: 9.9 mg/dL (ref 8.9–10.3)
Chloride: 105 mmol/L (ref 98–111)
Creatinine, Ser: 0.72 mg/dL (ref 0.44–1.00)
GFR, Estimated: >60 mL/min (ref >60)
Glucose, Bld: 111 mg/dL — ABNORMAL HIGH (ref 70–99)
Potassium: 3.5 mmol/L (ref 3.5–5.1)
Sodium: 139 mmol/L (ref 135–145)

## 2021-11-13 LAB — CBC
HCT: 38.4 % (ref 36.0–46.0)
Hemoglobin: 12.8 g/dL (ref 12.0–15.0)
MCH: 29.7 pg (ref 26.0–34.0)
MCHC: 33.3 g/dL (ref 30.0–36.0)
MCV: 89.1 fL (ref 80.0–100.0)
Platelets: 208 10*3/uL (ref 150–400)
RBC: 4.31 MIL/uL (ref 3.87–5.11)
RDW: 13.4 % (ref 11.5–15.5)
WBC: 5 10*3/uL (ref 4.0–10.5)
nRBC: 0 % (ref 0.0–0.2)

## 2021-11-13 LAB — TYPE AND SCREEN
ABO/RH(D): O POS
Antibody Screen: NEGATIVE

## 2021-11-13 SURGERY — DILATATION & CURETTAGE/HYSTEROSCOPY WITH MYOSURE
Anesthesia: General | Site: Vagina

## 2021-11-13 MED ORDER — EPHEDRINE SULFATE-NACL 50-0.9 MG/10ML-% IV SOSY
PREFILLED_SYRINGE | INTRAVENOUS | Status: DC | PRN
  Administered 2021-11-13: 5 mg via INTRAVENOUS

## 2021-11-13 MED ORDER — LACTATED RINGERS IV SOLN: INTRAVENOUS | Status: DC

## 2021-11-13 MED ORDER — MIDAZOLAM HCL 5 MG/5ML IJ SOLN
INTRAMUSCULAR | Status: DC | PRN
  Administered 2021-11-13: 2 mg via INTRAVENOUS

## 2021-11-13 MED ORDER — PROPOFOL 10 MG/ML IV BOLUS
INTRAVENOUS | Status: AC
Start: 2021-11-13 — End: ?
  Filled 2021-11-13: qty 20

## 2021-11-13 MED ORDER — MIDAZOLAM HCL 2 MG/2ML IJ SOLN
INTRAMUSCULAR | Status: AC
  Filled 2021-11-13: qty 2

## 2021-11-13 MED ORDER — SCOPOLAMINE 1 MG/3DAYS TD PT72
MEDICATED_PATCH | TRANSDERMAL | Status: AC
  Filled 2021-11-13: qty 1

## 2021-11-13 MED ORDER — OXYCODONE HCL 5 MG/5ML PO SOLN: 5.0000 mg | Freq: Once | ORAL | Status: DC | PRN

## 2021-11-13 MED ORDER — KETOROLAC TROMETHAMINE 30 MG/ML IJ SOLN
INTRAMUSCULAR | Status: DC | PRN
  Administered 2021-11-13: 30 mg via INTRAVENOUS

## 2021-11-13 MED ORDER — OXYCODONE HCL 5 MG PO TABS: 5.0000 mg | ORAL_TABLET | Freq: Once | ORAL | Status: DC | PRN

## 2021-11-13 MED ORDER — LIDOCAINE 2% (20 MG/ML) 5 ML SYRINGE
INTRAMUSCULAR | Status: DC | PRN
  Administered 2021-11-13: 40 mg via INTRAVENOUS

## 2021-11-13 MED ORDER — PROPOFOL 10 MG/ML IV BOLUS
INTRAVENOUS | Status: DC | PRN
  Administered 2021-11-13: 150 mg via INTRAVENOUS
  Administered 2021-11-13: 50 mg via INTRAVENOUS

## 2021-11-13 MED ORDER — PROPOFOL 10 MG/ML IV BOLUS
INTRAVENOUS | Status: AC
  Filled 2021-11-13: qty 20

## 2021-11-13 MED ORDER — KETOROLAC TROMETHAMINE 30 MG/ML IJ SOLN
INTRAMUSCULAR | Status: AC
  Filled 2021-11-13: qty 1

## 2021-11-13 MED ORDER — HYDROMORPHONE HCL 1 MG/ML IJ SOLN: 0.2500 mg | INTRAMUSCULAR | Status: DC | PRN

## 2021-11-13 MED ORDER — DEXAMETHASONE SODIUM PHOSPHATE 10 MG/ML IJ SOLN
INTRAMUSCULAR | Status: AC
  Filled 2021-11-13: qty 1

## 2021-11-13 MED ORDER — FENTANYL CITRATE (PF) 100 MCG/2ML IJ SOLN
INTRAMUSCULAR | Status: DC | PRN
  Administered 2021-11-13 (×2): 50 ug via INTRAVENOUS

## 2021-11-13 MED ORDER — ACETAMINOPHEN 325 MG PO TABS: 1000.0000 mg | ORAL_TABLET | Freq: Four times a day (QID) | ORAL | 0 refills | Status: AC | PRN

## 2021-11-13 MED ORDER — ACETAMINOPHEN 500 MG PO TABS
1000.0000 mg | ORAL_TABLET | Freq: Once | ORAL | Status: AC
  Administered 2021-11-13: 1000 mg via ORAL

## 2021-11-13 MED ORDER — ONDANSETRON HCL 4 MG/2ML IJ SOLN
INTRAMUSCULAR | Status: DC | PRN
  Administered 2021-11-13: 4 mg via INTRAVENOUS

## 2021-11-13 MED ORDER — EPHEDRINE 5 MG/ML INJ
INTRAVENOUS | Status: AC
  Filled 2021-11-13: qty 5

## 2021-11-13 MED ORDER — TRANEXAMIC ACID-NACL 1000-0.7 MG/100ML-% IV SOLN
INTRAVENOUS | Status: DC | PRN
  Administered 2021-11-13: 1000 mg via INTRAVENOUS

## 2021-11-13 MED ORDER — PHENYLEPHRINE 40 MCG/ML (10ML) SYRINGE FOR IV PUSH (FOR BLOOD PRESSURE SUPPORT)
PREFILLED_SYRINGE | INTRAVENOUS | Status: DC | PRN
  Administered 2021-11-13 (×5): 80 ug via INTRAVENOUS

## 2021-11-13 MED ORDER — TRANEXAMIC ACID-NACL 1000-0.7 MG/100ML-% IV SOLN
INTRAVENOUS | Status: AC
  Filled 2021-11-13: qty 100

## 2021-11-13 MED ORDER — SILVER NITRATE-POT NITRATE 75-25 % EX MISC
CUTANEOUS | Status: DC | PRN
Start: 2021-11-13 — End: 2021-11-13
  Administered 2021-11-13: 1
  Administered 2021-11-13: 2

## 2021-11-13 MED ORDER — DEXAMETHASONE SODIUM PHOSPHATE 10 MG/ML IJ SOLN
INTRAMUSCULAR | Status: DC | PRN
  Administered 2021-11-13: 8 mg via INTRAVENOUS

## 2021-11-13 MED ORDER — SCOPOLAMINE 1 MG/3DAYS TD PT72: 1.0000 | MEDICATED_PATCH | TRANSDERMAL | Status: DC

## 2021-11-13 MED ORDER — ONDANSETRON HCL 4 MG/2ML IJ SOLN
INTRAMUSCULAR | Status: AC
  Filled 2021-11-13: qty 2

## 2021-11-13 MED ORDER — MIDAZOLAM HCL 2 MG/2ML IJ SOLN: 0.5000 mg | Freq: Once | INTRAMUSCULAR | Status: DC | PRN

## 2021-11-13 MED ORDER — LIDOCAINE HCL 1 % IJ SOLN
INTRAMUSCULAR | Status: DC | PRN
Start: 2021-11-13 — End: 2021-11-13
  Administered 2021-11-13: 20 mL

## 2021-11-13 MED ORDER — PHENYLEPHRINE 40 MCG/ML (10ML) SYRINGE FOR IV PUSH (FOR BLOOD PRESSURE SUPPORT)
PREFILLED_SYRINGE | INTRAVENOUS | Status: AC
  Filled 2021-11-13: qty 10

## 2021-11-13 MED ORDER — POVIDONE-IODINE 10 % EX SWAB: 2.0000 "application " | Freq: Once | CUTANEOUS | Status: DC

## 2021-11-13 MED ORDER — FENTANYL CITRATE (PF) 100 MCG/2ML IJ SOLN
INTRAMUSCULAR | Status: AC
  Filled 2021-11-13: qty 2

## 2021-11-13 MED ORDER — ACETAMINOPHEN 500 MG PO TABS
ORAL_TABLET | ORAL | Status: AC
  Filled 2021-11-13: qty 2

## 2021-11-13 MED ORDER — MEPERIDINE HCL 25 MG/ML IJ SOLN: 6.2500 mg | INTRAMUSCULAR | Status: DC | PRN

## 2021-11-13 MED ORDER — SODIUM CHLORIDE 0.9 % IR SOLN
Status: DC | PRN
  Administered 2021-11-13 (×2): 3000 mL
  Administered 2021-11-13: 1000 mL
  Administered 2021-11-13: 3000 mL

## 2021-11-13 SURGICAL SUPPLY — 16 items
CATH ROBINSON RED A/P 16FR (CATHETERS) ×1 IMPLANT
DEVICE MYOSURE REACH (MISCELLANEOUS) ×3 IMPLANT
DRSG TELFA 3X8 NADH (GAUZE/BANDAGES/DRESSINGS) ×2 IMPLANT
GLOVE SURG ENC MOIS LTX SZ6.5 (GLOVE) ×3 IMPLANT
GLOVE SURG POLYISO LF SZ6 (GLOVE) ×2 IMPLANT
GLOVE SURG UNDER POLY LF SZ6.5 (GLOVE) ×2 IMPLANT
GOWN STRL REUS W/TWL LRG LVL3 (GOWN DISPOSABLE) ×5 IMPLANT
IV NS 1000ML (IV SOLUTION) ×2
IV NS 1000ML BAXH (IV SOLUTION) IMPLANT
IV NS IRRIG 3000ML ARTHROMATIC (IV SOLUTION) ×5 IMPLANT
KIT PROCEDURE FLUENT (KITS) ×3 IMPLANT
KIT TURNOVER CYSTO (KITS) ×3 IMPLANT
PACK VAGINAL MINOR WOMEN LF (CUSTOM PROCEDURE TRAY) ×3 IMPLANT
PAD DRESSING TELFA 3X8 NADH (GAUZE/BANDAGES/DRESSINGS) ×2 IMPLANT
PAD OB MATERNITY 4.3X12.25 (PERSONAL CARE ITEMS) ×3 IMPLANT
SEAL ROD LENS SCOPE MYOSURE (ABLATOR) ×3 IMPLANT

## 2021-11-13 NOTE — Interval H&P Note (Signed)
History and Physical Interval Note: ? ?11/13/2021 ?7:06 AM ? 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  The various methods of treatment have been discussed with the patient and family. After consideration of risks, benefits and other options for treatment, the patient has consented to  Procedure(s): ?DILATATION & CURETTAGE/HYSTEROSCOPY WITH [place] (N/A) as a surgical intervention.  The patient's history has been reviewed, patient examined, no change in status, stable for surgery.  I have reviewed the patient's chart and labs.  Questions were answered to the patient's satisfaction.   ? ? ?Logan Bores ? ? ?

## 2021-11-13 NOTE — Anesthesia Procedure Notes (Signed)
Procedure Name: LMA Insertion ?Date/Time: 11/13/2021 7:32 AM ?Performed by: Gwyndolyn Saxon, CRNA ?Pre-anesthesia Checklist: Patient identified, Emergency Drugs available, Suction available and Patient being monitored ?Patient Re-evaluated:Patient Re-evaluated prior to induction ?Oxygen Delivery Method: Circle system utilized ?Preoxygenation: Pre-oxygenation with 100% oxygen ?Induction Type: IV induction ?Ventilation: Mask ventilation without difficulty ?LMA: LMA inserted ?LMA Size: 4.0 ?Number of attempts: 1 ?Placement Confirmation: positive ETCO2 and breath sounds checked- equal and bilateral ?Tube secured with: Tape ?Dental Injury: Teeth and Oropharynx as per pre-operative assessment  ? ? ? ? ?

## 2021-11-13 NOTE — Anesthesia Postprocedure Evaluation (Signed)
Anesthesia Post Note ? ?Patient: Renee Roy ? ?Procedure(s) Performed: [place] (Vagina ) ? ?  ? ?Patient location during evaluation: PACU ?Anesthesia Type: General ?Level of consciousness: awake and alert, oriented and patient cooperative ?Pain management: pain level controlled ?Vital Signs Assessment: post-procedure vital signs reviewed and stable ?Respiratory status: spontaneous breathing, nonlabored ventilation and respiratory function stable ?Cardiovascular status: blood pressure returned to baseline and stable ?Postop Assessment: no apparent nausea or vomiting, adequate PO intake and able to ambulate ?Anesthetic complications: no ? ? ?No notable events documented. ? ?Last Vitals:  ?Vitals:  ? 11/13/21 1000 11/13/21 1030  ?BP: (!) 150/99 (!) 154/89  ?Pulse:  65  ?Resp: (!) 26 14  ?Temp:  36.4 ?C  ?SpO2:  100%  ?  ?Last Pain:  ?Vitals:  ? 11/13/21 1030  ?TempSrc:   ?PainSc: 0-No pain  ? ? ?  ?  ?  ?  ?  ?  ? ?Takeia Ciaravino,E. Tytionna Cloyd ? ? ? ? ?

## 2021-11-13 NOTE — Op Note (Signed)
Operative Note ? ? ? ?Preoperative Diagnosis ?Abnormal uterine bleeding ?Large submucosal fibroid 5cm ? ?Postoperative Diagnosis ?Same with remainder of endometrium atrophic ? ?Procedure ?Hysteroscopic Myomectomy with Myosure REACH ? ?Surgeon ?Paula Compton, MD ? ?Anesthesia ?LMA ? ?Fluids: ?EBL 158m ?UOP 100 ml In and out catheter after procedure ?IVF 5054m?Hysteroscopic deficit 21007m ?Findings ?There was a large vascular submucosal fibroid about 5cm in diameter invading the cavity with about 3/4 within the cavity.  The tubal ostia were normal and the endometrial lining appeared atrophic. ? ?Specimen ?Fibroid fragments ? ?Procedure Note  ?Patient was taken to the operating room where LMA anesthesia was obtained without difficulty. She was then prepped and draped in the normal sterile fashion in the dorsal lithotomy position. An appropriate time out was performed. A speculum was then placed within the vagina and the anterior lip of the cervix identified and injected with approximately 2 cc of 1% plain lidocaine. An additional 9 cc each was placed at 2 and 10:00 for a paracervical block. Uterus was then sounded to approximately 8-9 cm and the [facility] In[facility] utilized to dilate the cervix up to approximately 24. The Myosure REACH operating scope was then introduced into the cervix and the cavity inspected with findings as previously noted. The Myosure REACH operating blade was then introduced through the scope and under direct visualization the fibroid was resected down to about 1/4 remaining.  The deficit approached 2500m75mcause of the vascular nature of the fibroid so the procedure was concluded.  There was no concern for perforation.  There was some active bleeding at the conclusion of the procedure from the fibroid bed, but this tamponaded and slowed quickly once the hysteroscope was removed and the cavity became non-distended.   A dose of TXA was given to help slow any continued oozing from the fibroid  bed.   All tissue specimens were handed off to pathology. The tenaculum was then removed from the cervix and the site rendered hemostatic with silver nitrate. Finally the speculum was removed from the vagina and the patient awakened and taken to the recovery room in good condition.   ?

## 2021-11-13 NOTE — Transfer of Care (Signed)
Immediate Anesthesia Transfer of Care Note ? ?Patient: Renee Roy ? ?Procedure(s) Performed: [place] (Vagina ) ? ?Patient Location: PACU ? ?Anesthesia Type:General ? ?Level of Consciousness: drowsy ? ?Airway & Oxygen Therapy: Patient Spontanous Breathing and Patient connected to nasal cannula oxygen ? ?Post-op Assessment: Report given to RN and Post -op Vital signs reviewed and stable ? ?Post vital signs: Reviewed and stable ? ?Last Vitals:  ?Vitals Value Taken Time  ?BP 100/62 11/13/21 0830  ?Temp    ?Pulse 71 11/13/21 0829  ?Resp 9 11/13/21 0830  ?SpO2 96 % 11/13/21 0829  ?Vitals shown include unvalidated device data. ? ?Last Pain:  ?Vitals:  ? 11/13/21 0626  ?TempSrc: Oral  ?PainSc: 0-No pain  ?   ? ?Patients Stated Pain Goal: 1 (11/13/21 9450) ? ?Complications: No notable events documented. ?

## 2021-11-13 NOTE — Discharge Instructions (Signed)

## 2021-11-14 ENCOUNTER — Encounter (HOSPITAL_BASED_OUTPATIENT_CLINIC_OR_DEPARTMENT_OTHER): Payer: Self-pay | Admitting: Obstetrics and Gynecology

## 2021-11-14 LAB — SURGICAL PATHOLOGY

## 2022-07-30 ENCOUNTER — Other Ambulatory Visit: Payer: Self-pay

## 2022-07-30 ENCOUNTER — Emergency Department (HOSPITAL_COMMUNITY)
Admission: EM | Admit: 2022-07-30 | Discharge: 2022-07-31 | Disposition: A | Discharge status: Home / Self Care | Payer: Medicaid Other | Attending: Emergency Medicine | Admitting: Emergency Medicine

## 2022-07-30 ENCOUNTER — Encounter (HOSPITAL_COMMUNITY): Payer: Self-pay

## 2022-07-30 DIAGNOSIS — Z20822 Contact with and (suspected) exposure to covid-19: Secondary | ICD-10-CM | POA: Diagnosis not present

## 2022-07-30 DIAGNOSIS — J101 Influenza due to other identified influenza virus with other respiratory manifestations: Secondary | ICD-10-CM | POA: Diagnosis not present

## 2022-07-30 DIAGNOSIS — R059 Cough, unspecified: Secondary | ICD-10-CM | POA: Diagnosis present

## 2022-07-30 LAB — RESP PANEL BY RT-PCR (FLU A&B, COVID) ARPGX2
Influenza A by PCR: POSITIVE — AB
Influenza B by PCR: NEGATIVE
SARS Coronavirus 2 by RT PCR: NEGATIVE

## 2022-07-30 NOTE — ED Provider Triage Note (Signed)
Emergency Medicine Provider Triage Evaluation Note  Renee Roy , a 46 y.o. female  was evaluated in triage.  Pt complains of fatigue and myalgias. Here with son who was dx yesterday with Flu A. No emesis, CP, SOB.   Review of Systems  Positive: Fatigue, myalgias Negative: fever  Physical Exam  BP 114/74 (BP Location: Right Arm)   Pulse 100   Temp 98.7 F (37.1 C) (Oral)   Resp 18   SpO2 100%  Gen:   Awake, no distress   Resp:  Normal effort  MSK:   Moves extremities without difficulty  Other:    Medical Decision Making  Medically screening exam initiated at 10:41 PM.  Appropriate orders placed.  Vivia Budge was informed that the remainder of the evaluation will be completed by another provider, this initial triage assessment does not replace that evaluation, and the importance of remaining in the ED until their evaluation is complete.  Fatigue, myalgias  Suspect Flu given sons dx      Leonell Lobdell A, PA-C 07/30/22 2241

## 2022-07-30 NOTE — ED Triage Notes (Signed)
Son dx with flu yesterday. Suspects she has it also. C/o generalized body aches.

## 2022-07-31 MED ORDER — OSELTAMIVIR PHOSPHATE 75 MG PO CAPS: 75.0000 mg | ORAL_CAPSULE | Freq: Two times a day (BID) | ORAL | 0 refills | Status: AC

## 2022-07-31 NOTE — Discharge Instructions (Addendum)
You are flu positive.  I have sent Tamiflu to the pharmacy for you.  Make sure you drink plenty of fluids.  Tylenol Motrin as ED2 for fever control.  For any concerning symptoms return to the emergency room otherwise follow-up with your primary care provider.

## 2022-07-31 NOTE — ED Provider Notes (Signed)
[facility] DEPT Provider Note   CSN: [id number] Arrival date & time: 07/30/22  2227     History  Chief Complaint  Patient presents with   Generalized Body Aches    Renee Roy is a 46 y.o. female.  46 year old female presents today for evaluation of 2-day duration of cough, body aches.  Her son tested positive for flu.  She is able to tolerate p.o. intake including adequate hydration.  Denies other complaints.  She brought herself with for evaluation and figured she should also be tested.  Does not have any other complaints.  Had sore throat on Saturday but this has resolved.  The history is provided by the patient. No language interpreter was used.       Home Medications Prior to Admission medications   Medication Sig Start Date End Date Taking? Authorizing Provider  oseltamivir (TAMIFLU) 75 MG capsule Take 1 capsule (75 mg total) by mouth every 12 (twelve) hours. 07/31/22  Yes Deatra Canter, Demaree Liberto, PA-C  acetaminophen (TYLENOL) 325 MG tablet Take 3 tablets (975 mg total) by mouth every 6 (six) hours as needed. 11/13/21   Paula Compton, MD  Iron-FA-B Cmp-C-Biot-Probiotic (FUSION PLUS) CAPS Take 1 capsule by mouth daily.    [provider]  latanoprost (XALATAN) 0.005 % ophthalmic solution Place 1 drop into both eyes at bedtime.    [provider]  montelukast (SINGULAIR) 10 MG tablet Take 10 mg by mouth at bedtime.    [provider]  naproxen (NAPROSYN) 375 MG tablet Take 1 tablet (375 mg total) by mouth 2 (two) times daily. 03/18/21   Jaynee Eagles, PA-C  nystatin cream (MYCOSTATIN) Apply 1 application. topically 2 (two) times daily as needed for dry skin.    [provider]  olopatadine (PATANOL) 0.1 % ophthalmic solution Place 2 drops into both eyes 2 (two) times daily.    [provider]      Allergies    Patient has no known allergies.    Review of Systems   Review of Systems  Constitutional:  Negative  for chills and fever.  HENT:  Negative for congestion, postnasal drip and sore throat.   Respiratory:  Negative for shortness of breath.   Cardiovascular:  Negative for chest pain.  Neurological:  Negative for light-headedness.  All other systems reviewed and are negative.   Physical Exam Updated Vital Signs BP 114/74 (BP Location: Right Arm)   Pulse 100   Temp 98.7 F (37.1 C) (Oral)   Resp 18   SpO2 100%  Physical Exam Vitals and nursing note reviewed.  Constitutional:      General: She is not in acute distress.    Appearance: Normal appearance. She is not ill-appearing.  HENT:     Head: Normocephalic and atraumatic.     Nose: Nose normal.     Mouth/Throat:     Mouth: Mucous membranes are moist.     Pharynx: No oropharyngeal exudate or posterior oropharyngeal erythema.  Eyes:     General: No scleral icterus.    Extraocular Movements: Extraocular movements intact.     Conjunctiva/sclera: Conjunctivae normal.  Cardiovascular:     Rate and Rhythm: Normal rate and regular rhythm.     Pulses: Normal pulses.  Pulmonary:     Effort: Pulmonary effort is normal. No respiratory distress.     Breath sounds: Normal breath sounds. No wheezing or rales.  Abdominal:     General: There is no distension.     Tenderness: There  is no abdominal tenderness.  Musculoskeletal:        General: Normal range of motion.     Cervical back: Normal range of motion.  Skin:    General: Skin is warm and dry.  Neurological:     General: No focal deficit present.     Mental Status: She is alert. Mental status is at baseline.     ED Results / Procedures / Treatments   Labs (all labs ordered are listed, but only abnormal results are displayed) Labs Reviewed  RESP PANEL BY RT-PCR (FLU A&B, COVID) ARPGX2 - Abnormal; Notable for the following components:      Result Value   Influenza A by PCR POSITIVE (*)    All other components within normal limits    EKG None  Radiology No results  found.  Procedures Procedures    Medications Ordered in ED Medications - No data to display  ED Course/ Medical Decision Making/ A&P                           Medical Decision Making Risk Prescription drug management.   46 year old female presents today for evaluation of URI symptoms of 2 days duration.  Son tested positive for influenza.  Overall she is well-appearing.  Reports adequate hydration.  Symptomatic management discussed.  Will prescribe Tamiflu.  Return precaution discussed.  Patient voices understanding and is in agreement with plan.   Final Clinical Impression(s) / ED Diagnoses Final diagnoses:  Influenza A    Rx / DC Orders ED Discharge Orders          Ordered    oseltamivir (TAMIFLU) 75 MG capsule  Every 12 hours        07/31/22 0040              Evlyn Courier, PA-C 07/31/22 0108    Palumbo, April, MD 07/31/22 9924

## 2022-09-04 ENCOUNTER — Other Ambulatory Visit: Payer: Self-pay | Admitting: Obstetrics and Gynecology

## 2022-09-04 DIAGNOSIS — D250 Submucous leiomyoma of uterus: Secondary | ICD-10-CM

## 2022-10-18 ENCOUNTER — Telehealth: Payer: Self-pay | Admitting: Obstetrics and Gynecology

## 2022-10-18 NOTE — Telephone Encounter (Signed)
Called pt to schedule UFE. She is no longer interested

## 2022-10-18 NOTE — Telephone Encounter (Signed)
error 

## 2022-11-06 ENCOUNTER — Emergency Department (HOSPITAL_BASED_OUTPATIENT_CLINIC_OR_DEPARTMENT_OTHER)
Admission: EM | Admit: 2022-11-06 | Discharge: 2022-11-06 | Disposition: A | Discharge status: Home / Self Care | Payer: Medicaid Other | Attending: Emergency Medicine | Admitting: Emergency Medicine

## 2022-11-06 ENCOUNTER — Other Ambulatory Visit: Payer: Self-pay

## 2022-11-06 ENCOUNTER — Encounter (HOSPITAL_BASED_OUTPATIENT_CLINIC_OR_DEPARTMENT_OTHER): Payer: Self-pay

## 2022-11-06 DIAGNOSIS — N938 Other specified abnormal uterine and vaginal bleeding: Secondary | ICD-10-CM | POA: Diagnosis not present

## 2022-11-06 DIAGNOSIS — R1030 Lower abdominal pain, unspecified: Secondary | ICD-10-CM | POA: Diagnosis present

## 2022-11-06 LAB — COMPREHENSIVE METABOLIC PANEL
ALT: 12 U/L (ref 0–44)
AST: 14 U/L — ABNORMAL LOW (ref 15–41)
Albumin: 3.6 g/dL (ref 3.5–5.0)
Alkaline Phosphatase: 43 U/L (ref 38–126)
Anion gap: 7 (ref 5–15)
BUN: 8 mg/dL (ref 6–20)
CO2: 25 mmol/L (ref 22–32)
Calcium: 9.7 mg/dL (ref 8.9–10.3)
Chloride: 105 mmol/L (ref 98–111)
Creatinine, Ser: 0.79 mg/dL (ref 0.44–1.00)
GFR, Estimated: >60 mL/min (ref >60)
Glucose, Bld: 150 mg/dL — ABNORMAL HIGH (ref 70–99)
Potassium: 3.7 mmol/L (ref 3.5–5.1)
Sodium: 137 mmol/L (ref 135–145)
Total Bilirubin: 0.4 mg/dL (ref 0.3–1.2)
Total Protein: 7 g/dL (ref 6.5–8.1)

## 2022-11-06 LAB — URINALYSIS, ROUTINE W REFLEX MICROSCOPIC
Bacteria, UA: NONE SEEN
Bilirubin Urine: NEGATIVE
Glucose, UA: NEGATIVE mg/dL
Ketones, ur: NEGATIVE mg/dL
Leukocytes,Ua: NEGATIVE
Nitrite: NEGATIVE
Specific Gravity, Urine: 1.023 (ref 1.005–1.030)
pH: 6.5 (ref 5.0–8.0)

## 2022-11-06 LAB — CBC
HCT: 32.2 % — ABNORMAL LOW (ref 36.0–46.0)
Hemoglobin: 10.6 g/dL — ABNORMAL LOW (ref 12.0–15.0)
MCH: 28.8 pg (ref 26.0–34.0)
MCHC: 32.9 g/dL (ref 30.0–36.0)
MCV: 87.5 fL (ref 80.0–100.0)
Platelets: 249 10*3/uL (ref 150–400)
RBC: 3.68 MIL/uL — ABNORMAL LOW (ref 3.87–5.11)
RDW: 14 % (ref 11.5–15.5)
WBC: 6.7 10*3/uL (ref 4.0–10.5)
nRBC: 0 % (ref 0.0–0.2)

## 2022-11-06 LAB — HCG, SERUM, QUALITATIVE: Preg, Serum: NEGATIVE

## 2022-11-06 MED ORDER — NORGESTIMATE-ETH ESTRADIOL 0.25-35 MG-MCG PO TABS: ORAL_TABLET | ORAL | 11 refills | Status: AC

## 2022-11-06 NOTE — ED Triage Notes (Signed)
Patient here POV from Home.  Endorses Heavy Vaginal Bleeding since Friday. Some Generalized ABD Pain. Does take Birth Control (States she missed a few doses and the bleeding began). No Fever.   LMP: Renee Roy it was in January.   NAD Noted during Triage. A&Ox4. GCS 15. Ambulatory.

## 2022-11-06 NOTE — ED Provider Notes (Addendum)
[facility] Provider Note: Georgena Spurling, MD, FACEP  CSN: [id number] MRN: [id number] ARRIVAL: 11/06/22 at 2109 ROOM: DB014/DB014   CHIEF COMPLAINT  Vaginal Bleeding   HISTORY OF PRESENT ILLNESS  11/06/22 11:04 PM Renee Roy is a 47 y.o. female who is on Estarylla birth control pills.  She is at the start of her monthly cycle of pills.  She forgot to take her pills last Wednesday and Thursday (6 and 7 days ago, respectively).  Beginning 4 days ago she has had heavy vaginal bleeding accompanied by crampy suprapubic pain.  She has a history of fibroids and has had surgery for that and plans to have a hysterectomy in the coming months.   Past Medical History:  Diagnosis Date   Abnormal uterine bleeding (AUB)    Allergic rhinitis    Arthritis    chronic knee pain  bilateral   Chronic conjunctivitis    Environmental allergies    Glaucoma, both eyes    History of gestational diabetes    IDA (iron deficiency anemia)    Uterine fibroid    Wears glasses     Past Surgical History:  Procedure Laterality Date   DILATATION & CURETTAGE/HYSTEROSCOPY WITH MYOSURE N/A 11/13/2021   Procedure: [place];  Surgeon: Paula Compton, MD;  Location: [facility];  Service: Gynecology;  Laterality: N/A;   NO PAST SURGERIES      History reviewed. No pertinent family history.  Social History   Tobacco Use   Smoking status: Never   Smokeless tobacco: Never  Vaping Use   Vaping Use: Never used  Substance Use Topics   Alcohol use: Never   Drug use: Never    Prior to Admission medications   Medication Sig Start Date End Date Taking? Authorizing Provider  norgestimate-ethinyl estradiol (ESTARYLLA) 0.25-35 MG-MCG tablet Take 1 active tablet 3 times daily for 3 days or until bleeding stops.  Then take 1 active tab twice daily for 3 to 5 days.  Then take 1 active tab daily until all active tabs are completed. 11/06/22  Yes Aarini Slee, MD   acetaminophen (TYLENOL) 325 MG tablet Take 3 tablets (975 mg total) by mouth every 6 (six) hours as needed. 11/13/21   Paula Compton, MD  Iron-FA-B Cmp-C-Biot-Probiotic (FUSION PLUS) CAPS Take 1 capsule by mouth daily.    [provider]  latanoprost (XALATAN) 0.005 % ophthalmic solution Place 1 drop into both eyes at bedtime.    [provider]  montelukast (SINGULAIR) 10 MG tablet Take 10 mg by mouth at bedtime.    [provider]  naproxen (NAPROSYN) 375 MG tablet Take 1 tablet (375 mg total) by mouth 2 (two) times daily. 03/18/21   Jaynee Eagles, PA-C  nystatin cream (MYCOSTATIN) Apply 1 application. topically 2 (two) times daily as needed for dry skin.    [provider]  olopatadine (PATANOL) 0.1 % ophthalmic solution Place 2 drops into both eyes 2 (two) times daily.    [provider]  oseltamivir (TAMIFLU) 75 MG capsule Take 1 capsule (75 mg total) by mouth every 12 (twelve) hours. 07/31/22   Evlyn Courier, PA-C    Allergies Patient has no known allergies.   REVIEW OF SYSTEMS  Negative except as noted here or in the History of Present Illness.   PHYSICAL EXAMINATION  Initial Vital Signs Blood pressure 111/82, pulse 92, temperature 97.8 F (36.6 C), temperature source Oral, resp. rate 18, height '5\' 3"'$  (1.6 m), weight 66 kg,  SpO2 100 %.  Examination General: Well-developed, well-nourished female in no acute distress; appearance consistent with age of record HENT: normocephalic; atraumatic Eyes: Normal appearance Neck: supple Heart: regular rate and rhythm Lungs: clear to auscultation bilaterally Abdomen: soft; nondistended; nontender; bowel sounds present Extremities: No deformity; full range of motion; pulses normal Neurologic: Awake, alert and oriented; motor function intact in all extremities and symmetric; no facial droop Skin: Warm and dry Psychiatric: Normal mood and affect   RESULTS  Summary of this visit's results,  reviewed and interpreted by myself:   EKG Interpretation  Date/Time:    Ventricular Rate:    PR Interval:    QRS Duration:   QT Interval:    QTC Calculation:   R Axis:     Text Interpretation:         Laboratory Studies: Results for orders placed or performed during the hospital encounter of 11/06/22 (from the past 24 hour(s))  Comprehensive metabolic panel     Status: Abnormal   Collection Time: 11/06/22  9:20 PM  Result Value Ref Range   Sodium 137 135 - 145 mmol/L   Potassium 3.7 3.5 - 5.1 mmol/L   Chloride 105 98 - 111 mmol/L   CO2 25 22 - 32 mmol/L   Glucose, Bld 150 (H) 70 - 99 mg/dL   BUN 8 6 - 20 mg/dL   Creatinine, Ser 0.79 0.44 - 1.00 mg/dL   Calcium 9.7 8.9 - 10.3 mg/dL   Total Protein 7.0 6.5 - 8.1 g/dL   Albumin 3.6 3.5 - 5.0 g/dL   AST 14 (L) 15 - 41 U/L   ALT 12 0 - 44 U/L   Alkaline Phosphatase 43 38 - 126 U/L   Total Bilirubin 0.4 0.3 - 1.2 mg/dL   GFR, Estimated >60 >60 mL/min   Anion gap 7 5 - 15  CBC     Status: Abnormal   Collection Time: 11/06/22  9:20 PM  Result Value Ref Range   WBC 6.7 4.0 - 10.5 K/uL   RBC 3.68 (L) 3.87 - 5.11 MIL/uL   Hemoglobin 10.6 (L) 12.0 - 15.0 g/dL   HCT 32.2 (L) 36.0 - 46.0 %   MCV 87.5 80.0 - 100.0 fL   MCH 28.8 26.0 - 34.0 pg   MCHC 32.9 30.0 - 36.0 g/dL   RDW 14.0 11.5 - 15.5 %   Platelets 249 150 - 400 K/uL   nRBC 0.0 0.0 - 0.2 %  hCG, serum, qualitative     Status: None   Collection Time: 11/06/22  9:20 PM  Result Value Ref Range   Preg, Serum NEGATIVE NEGATIVE  Urinalysis, Routine w reflex microscopic -Urine, Clean Catch     Status: Abnormal   Collection Time: 11/06/22 11:00 PM  Result Value Ref Range   Color, Urine YELLOW YELLOW   APPearance CLEAR CLEAR   Specific Gravity, Urine 1.023 1.005 - 1.030   pH 6.5 5.0 - 8.0   Glucose, UA NEGATIVE NEGATIVE mg/dL   Hgb urine dipstick TRACE (A) NEGATIVE   Bilirubin Urine NEGATIVE NEGATIVE   Ketones, ur NEGATIVE NEGATIVE mg/dL   Protein, ur TRACE (A)  NEGATIVE mg/dL   Nitrite NEGATIVE NEGATIVE   Leukocytes,Ua NEGATIVE NEGATIVE   RBC / HPF 6-10 0 - 5 RBC/hpf   WBC, UA 0-5 0 - 5 WBC/hpf   Bacteria, UA NONE SEEN NONE SEEN   Squamous Epithelial / HPF 0-5 0 - 5 /HPF   Mucus PRESENT    Imaging Studies: No results  found.  ED COURSE and MDM  Nursing notes, initial and subsequent vitals signs, including pulse oximetry, reviewed and interpreted by myself.  Vitals:   11/06/22 2115 11/06/22 2116 11/06/22 2118  BP:  111/82   Pulse:  92   Resp:  18   Temp:   97.8 F (36.6 C)  TempSrc:   Oral  SpO2:  100%   Weight: 66 kg    Height: '5\' 3"'$  (1.6 m)     Medications - No data to display  We will have the patient take her Estarylla probably dysfunctional uterine bleeding protocol until the end of her current package of pills and then have her restart as usual.  PROCEDURES  Procedures   ED DIAGNOSES     ICD-10-CM   1. Dysfunctional uterine bleeding  N93.8          Malani Lees, MD 11/06/22 2319    Shanon Rosser, MD 11/06/22 [phone]

## 2022-12-11 ENCOUNTER — Other Ambulatory Visit: Payer: Self-pay

## 2022-12-11 ENCOUNTER — Encounter (HOSPITAL_BASED_OUTPATIENT_CLINIC_OR_DEPARTMENT_OTHER): Payer: Self-pay | Admitting: Obstetrics and Gynecology

## 2022-12-11 NOTE — Progress Notes (Signed)
Spoke w/ via phone for pre-op interview---pt Lab needs dos----   cbc, t & s, urine preg             Lab results------none COVID test -----patient states asymptomatic no test needed Arrive at -------530 am 12-20-2022 NPO after MN NO Solid Food.  Clear liquids from MN until---430 am Med rec completed Medications to take morning of surgery -----none Diabetic medication -----n/a Patient instructed no nail polish to be worn day of surgery Patient instructed to bring photo id and insurance card day of surgery Patient aware to have Driver (ride ) / caregiver  husband tahir   for 24 hours after surgery  Patient Special Instructions -----none Pre-Op special Istructions -----none Patient verbalized understanding of instructions that were given at this phone interview. Patient denies shortness of breath, chest pain, fever, cough at this phone interview.

## 2022-12-19 NOTE — Anesthesia Preprocedure Evaluation (Signed)
Anesthesia Evaluation  Patient identified by MRN, date of birth, ID band Patient awake    Reviewed: Allergy & Precautions, NPO status , Patient's Chart, lab work & pertinent test results  Airway Mallampati: II  TM Distance: >3 FB Neck ROM: Full    Dental  (+) Dental Advisory Given   Pulmonary neg pulmonary ROS   breath sounds clear to auscultation       Cardiovascular negative cardio ROS  Rhythm:Regular Rate:Normal     Neuro/Psych negative neurological ROS     GI/Hepatic negative GI ROS, Neg liver ROS,,,  Endo/Other  negative endocrine ROS    Renal/GU negative Renal ROS     Musculoskeletal   Abdominal   Peds  Hematology  (+) Blood dyscrasia, anemia   Anesthesia Other Findings   Reproductive/Obstetrics                             Anesthesia Physical Anesthesia Plan  ASA: 2  Anesthesia Plan: General   Post-op Pain Management: Tylenol PO (pre-op)* and Toradol IV (intra-op)*   Induction: Intravenous  PONV Risk Score and Plan: 3 and Dexamethasone, Midazolam, Ondansetron and Treatment may vary due to age or medical condition  Airway Management Planned: LMA  Additional Equipment:   Intra-op Plan:   Post-operative Plan: Extubation in OR  Informed Consent: I have reviewed the patients History and Physical, chart, labs and discussed the procedure including the risks, benefits and alternatives for the proposed anesthesia with the patient or authorized representative who has indicated his/her understanding and acceptance.     Dental advisory given  Plan Discussed with: CRNA  Anesthesia Plan Comments:        Anesthesia Quick Evaluation

## 2022-12-20 ENCOUNTER — Ambulatory Visit (HOSPITAL_BASED_OUTPATIENT_CLINIC_OR_DEPARTMENT_OTHER)
Admission: RE | Admit: 2022-12-20 | Discharge: 2022-12-20 | Disposition: A | Discharge status: Home / Self Care | Payer: Medicaid Other | Attending: Obstetrics and Gynecology | Admitting: Obstetrics and Gynecology

## 2022-12-20 ENCOUNTER — Encounter (HOSPITAL_BASED_OUTPATIENT_CLINIC_OR_DEPARTMENT_OTHER): Payer: Self-pay | Admitting: Obstetrics and Gynecology

## 2022-12-20 ENCOUNTER — Ambulatory Visit (HOSPITAL_BASED_OUTPATIENT_CLINIC_OR_DEPARTMENT_OTHER): Payer: Medicaid Other | Admitting: Anesthesiology

## 2022-12-20 ENCOUNTER — Encounter (HOSPITAL_BASED_OUTPATIENT_CLINIC_OR_DEPARTMENT_OTHER)
Admission: RE | Disposition: A | Discharge status: Home / Self Care | Payer: Self-pay | Source: Home / Self Care | Attending: Obstetrics and Gynecology

## 2022-12-20 ENCOUNTER — Other Ambulatory Visit: Payer: Self-pay

## 2022-12-20 DIAGNOSIS — D259 Leiomyoma of uterus, unspecified: Secondary | ICD-10-CM

## 2022-12-20 DIAGNOSIS — N938 Other specified abnormal uterine and vaginal bleeding: Secondary | ICD-10-CM | POA: Insufficient documentation

## 2022-12-20 DIAGNOSIS — D759 Disease of blood and blood-forming organs, unspecified: Secondary | ICD-10-CM | POA: Insufficient documentation

## 2022-12-20 DIAGNOSIS — D630 Anemia in neoplastic disease: Secondary | ICD-10-CM | POA: Diagnosis not present

## 2022-12-20 DIAGNOSIS — D250 Submucous leiomyoma of uterus: Secondary | ICD-10-CM | POA: Diagnosis not present

## 2022-12-20 DIAGNOSIS — Z01818 Encounter for other preprocedural examination: Secondary | ICD-10-CM

## 2022-12-20 DIAGNOSIS — D649 Anemia, unspecified: Secondary | ICD-10-CM | POA: Insufficient documentation

## 2022-12-20 HISTORY — PX: DILATATION & CURETTAGE/HYSTEROSCOPY WITH MYOSURE: SHX6511

## 2022-12-20 LAB — CBC
HCT: 32.5 % — ABNORMAL LOW (ref 36.0–46.0)
Hemoglobin: 10.1 g/dL — ABNORMAL LOW (ref 12.0–15.0)
MCH: 26.5 pg (ref 26.0–34.0)
MCHC: 31.1 g/dL (ref 30.0–36.0)
MCV: 85.3 fL (ref 80.0–100.0)
Platelets: 269 10*3/uL (ref 150–400)
RBC: 3.81 MIL/uL — ABNORMAL LOW (ref 3.87–5.11)
RDW: 15.6 % — ABNORMAL HIGH (ref 11.5–15.5)
WBC: 4.3 10*3/uL (ref 4.0–10.5)
nRBC: 0 % (ref 0.0–0.2)

## 2022-12-20 LAB — TYPE AND SCREEN
ABO/RH(D): O POS
Antibody Screen: NEGATIVE

## 2022-12-20 LAB — POCT PREGNANCY, URINE: Preg Test, Ur: NEGATIVE

## 2022-12-20 SURGERY — DILATATION & CURETTAGE/HYSTEROSCOPY WITH MYOSURE
Anesthesia: General

## 2022-12-20 MED ORDER — LIDOCAINE HCL 1 % IJ SOLN
INTRAMUSCULAR | Status: DC | PRN
  Administered 2022-12-20: 10 mL

## 2022-12-20 MED ORDER — POVIDONE-IODINE 10 % EX SWAB: 2.0000 | Freq: Once | CUTANEOUS | Status: DC

## 2022-12-20 MED ORDER — KETOROLAC TROMETHAMINE 30 MG/ML IJ SOLN: 30.0000 mg | Freq: Once | INTRAMUSCULAR | Status: DC

## 2022-12-20 MED ORDER — FENTANYL CITRATE (PF) 100 MCG/2ML IJ SOLN: 25.0000 ug | INTRAMUSCULAR | Status: DC | PRN

## 2022-12-20 MED ORDER — ONDANSETRON HCL 4 MG/2ML IJ SOLN
INTRAMUSCULAR | Status: AC
  Filled 2022-12-20: qty 2

## 2022-12-20 MED ORDER — PROPOFOL 10 MG/ML IV BOLUS
INTRAVENOUS | Status: AC
  Filled 2022-12-20: qty 20

## 2022-12-20 MED ORDER — PROPOFOL 10 MG/ML IV BOLUS
INTRAVENOUS | Status: DC | PRN
  Administered 2022-12-20: 150 mg via INTRAVENOUS

## 2022-12-20 MED ORDER — KETOROLAC TROMETHAMINE 30 MG/ML IJ SOLN
INTRAMUSCULAR | Status: AC
  Filled 2022-12-20: qty 1

## 2022-12-20 MED ORDER — SCOPOLAMINE 1 MG/3DAYS TD PT72
MEDICATED_PATCH | TRANSDERMAL | Status: AC
  Filled 2022-12-20: qty 1

## 2022-12-20 MED ORDER — LACTATED RINGERS IV SOLN: INTRAVENOUS | Status: DC

## 2022-12-20 MED ORDER — LIDOCAINE 2% (20 MG/ML) 5 ML SYRINGE
INTRAMUSCULAR | Status: DC | PRN
  Administered 2022-12-20: 60 mg via INTRAVENOUS

## 2022-12-20 MED ORDER — ACETAMINOPHEN 500 MG PO TABS
ORAL_TABLET | ORAL | Status: AC
  Filled 2022-12-20: qty 2

## 2022-12-20 MED ORDER — FENTANYL CITRATE (PF) 100 MCG/2ML IJ SOLN
INTRAMUSCULAR | Status: DC | PRN
  Administered 2022-12-20: 50 ug via INTRAVENOUS
  Administered 2022-12-20 (×2): 25 ug via INTRAVENOUS

## 2022-12-20 MED ORDER — OXYCODONE HCL 5 MG/5ML PO SOLN: 5.0000 mg | Freq: Once | ORAL | Status: DC | PRN

## 2022-12-20 MED ORDER — OXYCODONE-ACETAMINOPHEN 5-325 MG PO TABS: 1.0000 | ORAL_TABLET | ORAL | 0 refills | Status: AC | PRN

## 2022-12-20 MED ORDER — MIDAZOLAM HCL 2 MG/2ML IJ SOLN
INTRAMUSCULAR | Status: DC | PRN
  Administered 2022-12-20: 2 mg via INTRAVENOUS

## 2022-12-20 MED ORDER — DEXAMETHASONE SODIUM PHOSPHATE 10 MG/ML IJ SOLN
INTRAMUSCULAR | Status: AC
  Filled 2022-12-20: qty 1

## 2022-12-20 MED ORDER — SODIUM CHLORIDE 0.9 % IR SOLN
Status: DC | PRN
  Administered 2022-12-20 (×2): 1000 mL
  Administered 2022-12-20 (×2): 3000 mL

## 2022-12-20 MED ORDER — LIDOCAINE HCL (PF) 2 % IJ SOLN
INTRAMUSCULAR | Status: AC
  Filled 2022-12-20: qty 5

## 2022-12-20 MED ORDER — FENTANYL CITRATE (PF) 100 MCG/2ML IJ SOLN
INTRAMUSCULAR | Status: AC
  Filled 2022-12-20: qty 2

## 2022-12-20 MED ORDER — IBUPROFEN 600 MG PO TABS: 600.0000 mg | ORAL_TABLET | Freq: Four times a day (QID) | ORAL | 0 refills | Status: AC | PRN

## 2022-12-20 MED ORDER — AMISULPRIDE (ANTIEMETIC) 5 MG/2ML IV SOLN: 10.0000 mg | Freq: Once | INTRAVENOUS | Status: DC | PRN

## 2022-12-20 MED ORDER — DEXAMETHASONE SODIUM PHOSPHATE 10 MG/ML IJ SOLN
INTRAMUSCULAR | Status: DC | PRN
  Administered 2022-12-20 (×2): 5 mg via INTRAVENOUS

## 2022-12-20 MED ORDER — SCOPOLAMINE 1 MG/3DAYS TD PT72: 1.0000 | MEDICATED_PATCH | Freq: Once | TRANSDERMAL | Status: DC

## 2022-12-20 MED ORDER — MIDAZOLAM HCL 2 MG/2ML IJ SOLN
INTRAMUSCULAR | Status: AC
  Filled 2022-12-20: qty 2

## 2022-12-20 MED ORDER — ONDANSETRON HCL 4 MG/2ML IJ SOLN
INTRAMUSCULAR | Status: DC | PRN
  Administered 2022-12-20: 4 mg via INTRAVENOUS

## 2022-12-20 MED ORDER — ACETAMINOPHEN 500 MG PO TABS
1000.0000 mg | ORAL_TABLET | ORAL | Status: AC
  Administered 2022-12-20: 1000 mg via ORAL

## 2022-12-20 MED ORDER — OXYCODONE HCL 5 MG PO TABS: 5.0000 mg | ORAL_TABLET | Freq: Once | ORAL | Status: DC | PRN

## 2022-12-20 MED ORDER — KETOROLAC TROMETHAMINE 30 MG/ML IJ SOLN
INTRAMUSCULAR | Status: DC | PRN
  Administered 2022-12-20: 30 mg via INTRAVENOUS

## 2022-12-20 SURGICAL SUPPLY — 23 items
CATH ROBINSON RED A/P 16FR (CATHETERS) ×2 IMPLANT
DEVICE MYOSURE REACH (MISCELLANEOUS) IMPLANT
GLOVE BIO SURGEON STRL SZ 6 (GLOVE) IMPLANT
GLOVE BIO SURGEON STRL SZ 6.5 (GLOVE) ×2 IMPLANT
GLOVE BIOGEL PI IND STRL 6 (GLOVE) IMPLANT
GLOVE BIOGEL PI IND STRL 7.0 (GLOVE) IMPLANT
GLOVE BIOGEL PI IND STRL 7.5 (GLOVE) IMPLANT
GLOVE SURG SS PI 7.0 STRL IVOR (GLOVE) IMPLANT
GOWN STRL REUS W/TWL LRG LVL3 (GOWN DISPOSABLE) ×2 IMPLANT
GOWN STRL REUS W/TWL XL LVL3 (GOWN DISPOSABLE) IMPLANT
IV NS 1000ML (IV SOLUTION) ×2
IV NS 1000ML BAXH (IV SOLUTION) IMPLANT
IV NS IRRIG 3000ML ARTHROMATIC (IV SOLUTION) ×4 IMPLANT
KIT PROCEDURE FLUENT (KITS) ×2 IMPLANT
KIT TURNOVER CYSTO (KITS) ×2 IMPLANT
NDL SPNL 18GX3.5 QUINCKE PK (NEEDLE) IMPLANT
NEEDLE SPNL 18GX3.5 QUINCKE PK (NEEDLE) ×1 IMPLANT
PACK VAGINAL MINOR WOMEN LF (CUSTOM PROCEDURE TRAY) ×2 IMPLANT
PAD OB MATERNITY 4.3X12.25 (PERSONAL CARE ITEMS) ×2 IMPLANT
SEAL ROD LENS SCOPE MYOSURE (ABLATOR) ×2 IMPLANT
SLEEVE SCD COMPRESS KNEE MED (STOCKING) ×2 IMPLANT
SOL PREP POV-IOD 4OZ 10% (MISCELLANEOUS) IMPLANT
TOWEL OR 17X24 6PK STRL BLUE (TOWEL DISPOSABLE) ×2 IMPLANT

## 2022-12-20 NOTE — Discharge Instructions (Addendum)
DO NOT take Motrin/Advil/Ibuprofen until after 3 pm today.    Post Anesthesia Home Care Instructions  Activity: Get plenty of rest for the remainder of the day. A responsible adult should stay with you for 24 hours following the procedure.  For the next 24 hours, DO NOT: -Drive a car -Advertising copywriter -Drink alcoholic beverages -Take any medication unless instructed by your physician -Make any legal decisions or sign important papers.  Meals: Start with liquid foods such as gelatin or soup. Progress to regular foods as tolerated. Avoid greasy, spicy, heavy foods. If nausea and/or vomiting occur, drink only clear liquids until the nausea and/or vomiting subsides. Call your physician if vomiting continues.  Special Instructions/Symptoms: Your throat may feel dry or sore from the anesthesia or the breathing tube placed in your throat during surgery. If this causes discomfort, gargle with warm salt water. The discomfort should disappear within 24 hours.  If you had a scopolamine patch placed behind your ear for the management of post- operative nausea and/or vomiting:  1. The medication in the patch is effective for 72 hours, after which it should be removed.  Wrap patch in a tissue and discard in the trash. Wash hands thoroughly with soap and water. 2. You may remove the patch earlier than 72 hours if you experience unpleasant side effects which may include dry mouth, dizziness or visual disturbances. 3. Avoid touching the patch. Wash your hands with soap and water after contact with the patch.

## 2022-12-20 NOTE — Anesthesia Postprocedure Evaluation (Signed)
Anesthesia Post Note  Patient: Renee Roy  Procedure(s) Performed: DILATATION & CURETTAGE/HYSTEROSCOPY WITH MYOSURE REACH, RESECTION OF FIBROID     Patient location during evaluation: PACU Anesthesia Type: General Level of consciousness: awake and alert Pain management: pain level controlled Vital Signs Assessment: post-procedure vital signs reviewed and stable Respiratory status: spontaneous breathing, nonlabored ventilation, respiratory function stable and patient connected to nasal cannula oxygen Cardiovascular status: blood pressure returned to baseline and stable Postop Assessment: no apparent nausea or vomiting Anesthetic complications: no   No notable events documented.  Last Vitals:  Vitals:   12/20/22 0603 12/20/22 0910  BP: 133/82 126/83  Pulse: 80 74  Resp: 16 12  Temp: (!) 36.4 C   SpO2: 100% 98%    Last Pain:  Vitals:   12/20/22 0910  TempSrc:   PainSc: Asleep                 Maryella Abood

## 2022-12-20 NOTE — Transfer of Care (Signed)
Immediate Anesthesia Transfer of Care Note  Patient: Baldemar Lenis  Procedure(s) Performed: Procedure(s) (LRB): DILATATION & CURETTAGE/HYSTEROSCOPY WITH MYOSURE REACH, RESECTION OF FIBROID (N/A)  Patient Location: PACU  Anesthesia Type: General  Level of Consciousness: awake, oriented, sedated and patient cooperative  Airway & Oxygen Therapy: Patient Spontanous Breathing and Patient connected to face mask oxygen  Post-op Assessment: Report given to PACU RN and Post -op Vital signs reviewed and stable  Post vital signs: Reviewed and stable  Complications: No apparent anesthesia complications Last Vitals:  Vitals Value Taken Time  BP 126/83 12/20/22 0910  Temp    Pulse 73 12/20/22 0912  Resp 9 12/20/22 0912  SpO2 99 % 12/20/22 0912  Vitals shown include unvalidated device data.  Last Pain:  Vitals:   12/20/22 0603  TempSrc: Oral  PainSc: 0-No pain      Patients Stated Pain Goal: 5 (12/20/22 0603)  Complications: No notable events documented.

## 2022-12-20 NOTE — H&P (Addendum)
Renee Roy is an 47 y.o. G58P3003 female with a residual submucosal uterine fibroid here for hysteroscopic removal.  Pt had procedure done in 10/2021 but had to be aborted with about 1/3 of fibroid remaining due to increased fluid deficit. She continued to have abnormal uterine bleeding and after review of options opted for same procedure again.   Pertinent Gynecological History: Menses:  heavy Bleeding: dysfunctional uterine bleeding Contraception: none DES exposure: denies Blood transfusions: none Sexually transmitted diseases: no past history Previous GYN Procedures: DNC and myosure    Last mammogram: normal Date:  Last pap: normal Date:  OB History: G0, P   Menstrual History: Menarche age: 53 Patient's last menstrual period was 10/30/2022.    Past Medical History:  Diagnosis Date   Abnormal uterine bleeding (AUB)    Allergic rhinitis    Arthritis    chronic knee pain  bilateral   Environmental allergies    Glaucoma, both eyes    History of gestational diabetes 2012   IDA (iron deficiency anemia)    Uterine fibroid    Wears glasses     Past Surgical History:  Procedure Laterality Date   DILATATION & CURETTAGE/HYSTEROSCOPY WITH MYOSURE N/A 11/13/2021   Procedure: HYSTEROSCOPIC MYOMECTOMY WITH MYOSURE REACH;  Surgeon: Huel Cote, MD;  Location: [facility] [place];  Service: Gynecology;  Laterality: N/A;    History reviewed. No pertinent family history.  Social History:  reports that she has never smoked. She has never used smokeless tobacco. She reports that she does not drink alcohol and does not use drugs.  Allergies: No Known Allergies  Medications Prior to Admission  Medication Sig Dispense Refill Last Dose   Iron-FA-B Cmp-C-Biot-Probiotic (FUSION PLUS) CAPS Take 1 capsule by mouth daily.   12/19/2022   latanoprost (XALATAN) 0.005 % ophthalmic solution Place 1 drop into both eyes at bedtime.   12/19/2022   norgestimate-ethinyl estradiol (ESTARYLLA)  0.25-35 MG-MCG tablet Take 1 active tablet 3 times daily for 3 days or until bleeding stops.  Then take 1 active tab twice daily for 3 to 5 days.  Then take 1 active tab daily until all active tabs are completed. (Patient taking differently: at bedtime. Take 1 active tablet 3 times daily for 3 days or until bleeding stops.  Then take 1 active tab twice daily for 3 to 5 days.  Then take 1 active tab daily until all active tabs are completed.) 28 tablet 11 12/19/2022   olopatadine (PATADAY) 0.1 % ophthalmic solution Place 1 drop into both eyes as needed for allergies.   Past Week   sodium chloride (OCEAN) 0.65 % SOLN nasal spray Place 1 spray into both nostrils 2 (two) times daily. Olpataday nasal spray   12/19/2022   acetaminophen (TYLENOL) 325 MG tablet Take 3 tablets (975 mg total) by mouth every 6 (six) hours as needed. 30 tablet 0 More than a month   montelukast (SINGULAIR) 10 MG tablet Take 10 mg by mouth as needed.   More than a month   naproxen (NAPROSYN) 375 MG tablet Take 1 tablet (375 mg total) by mouth 2 (two) times daily. 20 tablet 0 More than a month   olopatadine (PATANOL) 0.1 % ophthalmic solution Place 2 drops into both eyes 2 (two) times daily.      oseltamivir (TAMIFLU) 75 MG capsule Take 1 capsule (75 mg total) by mouth every 12 (twelve) hours. (Patient not taking: Reported on 12/20/2022) 10 capsule 0 Not Taking    Review of Systems  Constitutional:  Negative for activity change and fatigue.  Eyes:  Negative for photophobia and visual disturbance.  Respiratory:  Negative for chest tightness and shortness of breath.   Cardiovascular:  Negative for chest pain, palpitations and leg swelling.  Genitourinary:  Positive for menstrual problem and vaginal bleeding. Negative for pelvic pain.  Neurological:  Negative for light-headedness and headaches.  Psychiatric/Behavioral:  The patient is nervous/anxious.     Blood pressure 133/82, pulse 80, temperature (!) 97.5 F (36.4 C), temperature  source Oral, resp. rate 16, height  (1.6 m), weight 61.8 kg, last menstrual period 10/30/2022, SpO2 100 %. Physical Exam Constitutional:      Appearance: Normal appearance. She is normal weight.  Cardiovascular:     Pulses: Normal pulses.  Pulmonary:     Effort: Pulmonary effort is normal.  Musculoskeletal:        General: Normal range of motion.     Cervical back: Normal range of motion.  Skin:    General: Skin is warm.     Capillary Refill: Capillary refill takes 2 to 3 seconds.  Neurological:     General: No focal deficit present.     Mental Status: She is alert and oriented to person, place, and time. Mental status is at baseline.     Results for orders placed or performed during the hospital encounter of 12/20/22 (from the past 24 hour(s))  Pregnancy, urine POC     Status: None   Collection Time: 12/20/22  5:44 AM  Result Value Ref Range   Preg Test, Ur NEGATIVE NEGATIVE  CBC     Status: Abnormal   Collection Time: 12/20/22  6:16 AM  Result Value Ref Range   WBC 4.3 4.0 - 10.5 K/uL   RBC 3.81 (L) 3.87 - 5.11 MIL/uL   Hemoglobin 10.1 (L) 12.0 - 15.0 g/dL   HCT 16.1 (L) 09.6 - 04.5 %   MCV 85.3 80.0 - 100.0 fL   MCH 26.5 26.0 - 34.0 pg   MCHC 31.1 30.0 - 36.0 g/dL   RDW 40.9 (H) 81.1 - 91.4 %   Platelets 269 150 - 400 K/uL   nRBC 0.0 0.0 - 0.2 %    No results found.  Assessment/Plan: 47yo G3P3003 female with DUB here for hysteroscopic myomectomy with dilation and curettage  - Admit  - Consent obtained - Option of myosure reach vs xcel in OR - TO OR when ready   Nilsa Macht W Darleene Cumpian 12/20/2022, 7:31 AM

## 2022-12-20 NOTE — Op Note (Signed)
Operative Note    Preoperative Diagnosis Submucosal fibroid  Abnormal uterine bleeding    Postoperative Diagnosis: Same    Procedure: Hysteroscopic myomectomy using myosure and dilation and curettage   Surgeon: Mindi Slicker, C DO   Anesthesia: General   Fluids:LR 1.5L  EBL: 10ml UOP: Fluid deficit :  Findings: submucosal fibroid on anterior right uterine wall, normal positions of ostia bilaterally    Specimen: uterine fibroid and endometrial tissue to pathology    Procedure Note Patient was taken to the operating room where general anesthesia was administered without difficulty. She was prepped and draped in the normal sterile fashion in the dorsal lithotomy position. An appropriate time out was performed.  A speculum was then placed in the vagina. The anterior lip of the cervix was grasped with a single toothed tenaculum and 1% lidocaine plain injected at 3 and 9 o'clock for a paracervical block.  The uterus was then sounded to 10cm and the [facility] dilators utilized to dilate the cervix up to size 17.  The Myosure operating scope was then introduced into the uterine cavity. Equipment had to be changed for better visualization. The cavity was noted to have a secretory phase endometrium and one 3cm wide submucosal anterior right uterine polyp was noted. Both ostia were in normal anatomic position.  Next, using the myosure reach, the fibroid was removed in its entirety. A few small protrusions into the uterine cavity were also removed. Hemostasis appreciated.  The samples were handed off to be sent to pathology.  At this time, all instruments were removed. The tenaculum site was noted to be hemostatic.  The speculum was removed from the vagina and the patient awakened and taken to the recovery room in stable condition.   All counts were noted to be correct x 2

## 2022-12-20 NOTE — Anesthesia Procedure Notes (Signed)
Procedure Name: LMA Insertion Date/Time: 12/20/2022 7:45 AM  Performed by: Francie Massing, CRNAPre-anesthesia Checklist: Patient identified, Emergency Drugs available, Suction available and Patient being monitored Patient Re-evaluated:Patient Re-evaluated prior to induction Oxygen Delivery Method: Circle system utilized Preoxygenation: Pre-oxygenation with 100% oxygen Induction Type: IV induction Ventilation: Mask ventilation without difficulty LMA: LMA inserted LMA Size: 4.0 Number of attempts: 1 Airway Equipment and Method: Bite block Placement Confirmation: positive ETCO2 Tube secured with: Tape Dental Injury: Teeth and Oropharynx as per pre-operative assessment

## 2022-12-21 LAB — SURGICAL PATHOLOGY

## 2022-12-22 ENCOUNTER — Other Ambulatory Visit: Payer: Self-pay

## 2022-12-22 DIAGNOSIS — D259 Leiomyoma of uterus, unspecified: Secondary | ICD-10-CM | POA: Insufficient documentation

## 2022-12-22 DIAGNOSIS — R102 Pelvic and perineal pain: Secondary | ICD-10-CM | POA: Diagnosis present

## 2022-12-23 ENCOUNTER — Emergency Department (HOSPITAL_BASED_OUTPATIENT_CLINIC_OR_DEPARTMENT_OTHER)
Admission: EM | Admit: 2022-12-23 | Discharge: 2022-12-23 | Disposition: A | Discharge status: Home / Self Care | Payer: Medicaid Other | Attending: Emergency Medicine | Admitting: Emergency Medicine

## 2022-12-23 ENCOUNTER — Emergency Department (HOSPITAL_BASED_OUTPATIENT_CLINIC_OR_DEPARTMENT_OTHER): Payer: Medicaid Other

## 2022-12-23 DIAGNOSIS — N939 Abnormal uterine and vaginal bleeding, unspecified: Secondary | ICD-10-CM

## 2022-12-23 DIAGNOSIS — D219 Benign neoplasm of connective and other soft tissue, unspecified: Secondary | ICD-10-CM

## 2022-12-23 DIAGNOSIS — G8918 Other acute postprocedural pain: Secondary | ICD-10-CM

## 2022-12-23 LAB — CBC
HCT: 28.7 % — ABNORMAL LOW (ref 36.0–46.0)
Hemoglobin: 9.3 g/dL — ABNORMAL LOW (ref 12.0–15.0)
MCH: 27.3 pg (ref 26.0–34.0)
MCHC: 32.4 g/dL (ref 30.0–36.0)
MCV: 84.2 fL (ref 80.0–100.0)
Platelets: 263 10*3/uL (ref 150–400)
RBC: 3.41 MIL/uL — ABNORMAL LOW (ref 3.87–5.11)
RDW: 15.7 % — ABNORMAL HIGH (ref 11.5–15.5)
WBC: 8.8 10*3/uL (ref 4.0–10.5)
nRBC: 0 % (ref 0.0–0.2)

## 2022-12-23 LAB — COMPREHENSIVE METABOLIC PANEL
ALT: 12 U/L (ref 0–44)
AST: 12 U/L — ABNORMAL LOW (ref 15–41)
Albumin: 3.7 g/dL (ref 3.5–5.0)
Alkaline Phosphatase: 52 U/L (ref 38–126)
Anion gap: 9 (ref 5–15)
BUN: 9 mg/dL (ref 6–20)
CO2: 23 mmol/L (ref 22–32)
Calcium: 8.9 mg/dL (ref 8.9–10.3)
Chloride: 104 mmol/L (ref 98–111)
Creatinine, Ser: 0.64 mg/dL (ref 0.44–1.00)
GFR, Estimated: >60 mL/min (ref >60)
Glucose, Bld: 125 mg/dL — ABNORMAL HIGH (ref 70–99)
Potassium: 3.8 mmol/L (ref 3.5–5.1)
Sodium: 136 mmol/L (ref 135–145)
Total Bilirubin: 0.3 mg/dL (ref 0.3–1.2)
Total Protein: 6.7 g/dL (ref 6.5–8.1)

## 2022-12-23 LAB — URINALYSIS, ROUTINE W REFLEX MICROSCOPIC
Bilirubin Urine: NEGATIVE
Glucose, UA: NEGATIVE mg/dL
Hgb urine dipstick: NEGATIVE
Ketones, ur: NEGATIVE mg/dL
Leukocytes,Ua: NEGATIVE
Nitrite: NEGATIVE
Protein, ur: NEGATIVE mg/dL
Specific Gravity, Urine: 1.01 (ref 1.005–1.030)
pH: 6.5 (ref 5.0–8.0)

## 2022-12-23 LAB — LIPASE, BLOOD: Lipase: 12 U/L (ref 11–51)

## 2022-12-23 LAB — HCG, SERUM, QUALITATIVE: Preg, Serum: NEGATIVE

## 2022-12-23 MED ORDER — IOHEXOL 300 MG/ML  SOLN
100.0000 mL | Freq: Once | INTRAMUSCULAR | Status: AC | PRN
  Administered 2022-12-23: 100 mL via INTRAVENOUS

## 2022-12-23 MED ORDER — IBUPROFEN 800 MG PO TABS: 800.0000 mg | ORAL_TABLET | Freq: Three times a day (TID) | ORAL | 0 refills | Status: AC

## 2022-12-23 MED ORDER — SODIUM CHLORIDE 0.9 % IV BOLUS
1000.0000 mL | Freq: Once | INTRAVENOUS | Status: AC
  Administered 2022-12-23: 1000 mL via INTRAVENOUS

## 2022-12-23 MED ORDER — TRANEXAMIC ACID-NACL 1000-0.7 MG/100ML-% IV SOLN
1000.0000 mg | INTRAVENOUS | Status: AC
  Administered 2022-12-23: 1000 mg via INTRAVENOUS
  Filled 2022-12-23: qty 100

## 2022-12-23 MED ORDER — ONDANSETRON HCL 4 MG/2ML IJ SOLN
4.0000 mg | Freq: Once | INTRAMUSCULAR | Status: AC
  Administered 2022-12-23: 4 mg via INTRAVENOUS
  Filled 2022-12-23: qty 2

## 2022-12-23 MED ORDER — KETOROLAC TROMETHAMINE 30 MG/ML IJ SOLN
30.0000 mg | Freq: Once | INTRAMUSCULAR | Status: AC
  Administered 2022-12-23: 30 mg via INTRAVENOUS
  Filled 2022-12-23: qty 1

## 2022-12-23 NOTE — ED Triage Notes (Signed)
Presents for LLQ abd pain and L flank pain that started this morning and has gotten worse, feels like cramping. Bleeding through 3 pads per hour.  Denies urinary sx, fever Endorses vaginal bleeding from surgery Thurs (fibroid removal), chills, bloating, lightheadness  Pt stands in triage d/t pain

## 2022-12-23 NOTE — ED Notes (Signed)
Reviewed AVS with patient, patient expressed understanding of directions, denies further questions at this time. 

## 2022-12-23 NOTE — ED Provider Notes (Signed)
[place] EMERGENCY DEPARTMENT AT [facility] Provider Note   CSN: [id number] Arrival date & time: 12/22/22  2319     History  Chief Complaint  Patient presents with   Abdominal Pain    Renee Roy is a 47 y.o. female.  The history is provided by the patient.  Abdominal Pain Pain location: pelvic pain. Pain radiates to:  Does not radiate Pain severity:  Severe Onset quality:  Sudden Duration:  2 days Timing:  Constant Progression:  Unchanged Chronicity:  New Context: not alcohol use   Relieved by:  Nothing Worsened by:  Nothing Ineffective treatments: percocet and TXA orally. Associated symptoms: vaginal bleeding   Patient is post op from DC and hysteroscopy and fibroid procedure on Thursday and had immediate pain and bleeding and spoke to Dr. Mindi Slicker about this and it has continued      Home Medications Prior to Admission medications   Medication Sig Start Date End Date Taking? Authorizing Provider  ibuprofen (ADVIL) 800 MG tablet Take 1 tablet (800 mg total) by mouth 3 (three) times daily. 12/23/22  Yes Breelyn Icard, MD  acetaminophen (TYLENOL) 325 MG tablet Take 3 tablets (975 mg total) by mouth every 6 (six) hours as needed. 11/13/21   Huel Cote, MD  ibuprofen (ADVIL) 600 MG tablet Take 1 tablet (600 mg total) by mouth every 6 (six) hours as needed for moderate pain or cramping. 12/20/22   Banga, Mare Loan Worema, DO  Iron-FA-B Cmp-C-Biot-Probiotic (FUSION PLUS) CAPS Take 1 capsule by mouth daily.    [provider]  latanoprost (XALATAN) 0.005 % ophthalmic solution Place 1 drop into both eyes at bedtime.    [provider]  montelukast (SINGULAIR) 10 MG tablet Take 10 mg by mouth as needed.    [provider]  naproxen (NAPROSYN) 375 MG tablet Take 1 tablet (375 mg total) by mouth 2 (two) times daily. 03/18/21   Wallis Bamberg, PA-C  norgestimate-ethinyl estradiol (ESTARYLLA) 0.25-35 MG-MCG tablet Take 1 active tablet 3 times daily  for 3 days or until bleeding stops.  Then take 1 active tab twice daily for 3 to 5 days.  Then take 1 active tab daily until all active tabs are completed. Patient taking differently: at bedtime. Take 1 active tablet 3 times daily for 3 days or until bleeding stops.  Then take 1 active tab twice daily for 3 to 5 days.  Then take 1 active tab daily until all active tabs are completed. 11/06/22   Molpus, John, MD  olopatadine (PATADAY) 0.1 % ophthalmic solution Place 1 drop into both eyes as needed for allergies.    [provider]  olopatadine (PATANOL) 0.1 % ophthalmic solution Place 2 drops into both eyes 2 (two) times daily.    [provider]  oseltamivir (TAMIFLU) 75 MG capsule Take 1 capsule (75 mg total) by mouth every 12 (twelve) hours. Patient not taking: Reported on 12/20/2022 07/31/22   Marita Kansas, PA-C  oxyCODONE-acetaminophen (PERCOCET) 5-325 MG tablet Take 1 tablet by mouth every 4 (four) hours as needed for up to 5 days for severe pain. 12/20/22 12/25/22  Edwinna Areola, DO  sodium chloride (OCEAN) 0.65 % SOLN nasal spray Place 1 spray into both nostrils 2 (two) times daily. Olpataday nasal spray    [provider]      Allergies    Patient has no known allergies.    Review of Systems   Review of Systems  Gastrointestinal:  Positive for abdominal pain.  Genitourinary:  Positive for vaginal bleeding.    Physical Exam Updated Vital Signs BP 113/73   Pulse 79   Temp 97.6 F (36.4 C) (Oral)   Resp 18   Wt 61 kg   SpO2 99%   BMI 23.82 kg/m  Physical Exam Vitals and nursing note reviewed. Exam conducted with a chaperone present.  Constitutional:      General: She is not in acute distress.    Appearance: Normal appearance. She is well-developed.  HENT:     Head: Normocephalic and atraumatic.     Nose: Nose normal.  Eyes:     Pupils: Pupils are equal, round, and reactive to light.  Cardiovascular:     Rate and Rhythm: Normal rate and regular  rhythm.     Pulses: Normal pulses.     Heart sounds: Normal heart sounds.  Pulmonary:     Effort: Pulmonary effort is normal. No respiratory distress.     Breath sounds: Normal breath sounds.  Abdominal:     General: Bowel sounds are normal. There is no distension.     Palpations: Abdomen is soft.     Tenderness: There is no abdominal tenderness. There is no guarding or rebound.  Genitourinary:    Vagina: No vaginal discharge.  Musculoskeletal:        General: Normal range of motion.     Cervical back: Neck supple.  Skin:    General: Skin is warm and dry.     Capillary Refill: Capillary refill takes less than 2 seconds.     Findings: No erythema or rash.  Neurological:     General: No focal deficit present.     Mental Status: She is alert and oriented to person, place, and time.     Deep Tendon Reflexes: Reflexes normal.  Psychiatric:        Mood and Affect: Mood normal.     ED Results / Procedures / Treatments   Labs (all labs ordered are listed, but only abnormal results are displayed) Results for orders placed or performed during the hospital encounter of 12/23/22  Lipase, blood  Result Value Ref Range   Lipase 12 11 - 51 U/L  Comprehensive metabolic panel  Result Value Ref Range   Sodium 136 135 - 145 mmol/L   Potassium 3.8 3.5 - 5.1 mmol/L   Chloride 104 98 - 111 mmol/L   CO2 23 22 - 32 mmol/L   Glucose, Bld 125 (H) 70 - 99 mg/dL   BUN 9 6 - 20 mg/dL   Creatinine, Ser 1.61 0.44 - 1.00 mg/dL   Calcium 8.9 8.9 - 09.6 mg/dL   Total Protein 6.7 6.5 - 8.1 g/dL   Albumin 3.7 3.5 - 5.0 g/dL   AST 12 (L) 15 - 41 U/L   ALT 12 0 - 44 U/L   Alkaline Phosphatase 52 38 - 126 U/L   Total Bilirubin 0.3 0.3 - 1.2 mg/dL   GFR, Estimated >04 >54 mL/min   Anion gap 9 5 - 15  CBC  Result Value Ref Range   WBC 8.8 4.0 - 10.5 K/uL   RBC 3.41 (L) 3.87 - 5.11 MIL/uL   Hemoglobin 9.3 (L) 12.0 - 15.0 g/dL   HCT 09.8 (L) 11.9 - 14.7 %   MCV 84.2 80.0 - 100.0 fL   MCH 27.3 26.0 -  34.0 pg   MCHC 32.4 30.0 - 36.0 g/dL   RDW 82.9 (H) 56.2 - 13.0 %   Platelets 263 150 - 400 K/uL  nRBC 0.0 0.0 - 0.2 %  Urinalysis, Routine w reflex microscopic -Urine, Clean Catch  Result Value Ref Range   Color, Urine COLORLESS (A) YELLOW   APPearance CLEAR CLEAR   Specific Gravity, Urine 1.010 1.005 - 1.030   pH 6.5 5.0 - 8.0   Glucose, UA NEGATIVE NEGATIVE mg/dL   Hgb urine dipstick NEGATIVE NEGATIVE   Bilirubin Urine NEGATIVE NEGATIVE   Ketones, ur NEGATIVE NEGATIVE mg/dL   Protein, ur NEGATIVE NEGATIVE mg/dL   Nitrite NEGATIVE NEGATIVE   Leukocytes,Ua NEGATIVE NEGATIVE  hCG, serum, qualitative  Result Value Ref Range   Preg, Serum NEGATIVE NEGATIVE   CT ABDOMEN PELVIS W CONTRAST  Result Date: 12/23/2022 CLINICAL DATA:  Left lower quadrant pain. EXAM: CT ABDOMEN AND PELVIS WITH CONTRAST TECHNIQUE: Multidetector CT imaging of the abdomen and pelvis was performed using the standard protocol following bolus administration of intravenous contrast. RADIATION DOSE REDUCTION: This exam was performed according to the departmental dose-optimization program which includes automated exposure control, adjustment of the mA and/or kV according to patient size and/or use of iterative reconstruction technique. CONTRAST:  OMNIPAQUE IOHEXOL 300 MG/ML  SOLN COMPARISON:  None Available. FINDINGS: Lower chest: No acute abnormality. Hepatobiliary: No focal liver abnormality is seen. No gallstones, gallbladder wall thickening, or biliary dilatation. Pancreas: Unremarkable. No pancreatic ductal dilatation or surrounding inflammatory changes. Spleen: Normal in size without focal abnormality. Adrenals/Urinary Tract: Adrenal glands are unremarkable. Kidneys are normal in size, without renal calculi or hydronephrosis. A 13 cm diameter simple cyst is seen within the posterior aspect of the upper pole of the left kidney. Bladder is unremarkable. Stomach/Bowel: Stomach is within normal limits. Appendix appears  normal. No evidence of bowel wall thickening, distention, or inflammatory changes. Vascular/Lymphatic: No significant vascular findings are present. No enlarged abdominal or pelvic lymph nodes. Reproductive: A 5.5 cm x 5.2 cm x 6.8 cm heterogeneous, partially calcified, enhancing soft tissue mass is seen within the anterior aspect of the uterus. This extends into the endometrium. The bilateral adnexa are unremarkable. Other: No abdominal wall hernia or abnormality. No abdominopelvic ascites. Musculoskeletal: No acute or significant osseous findings. IMPRESSION: 1. Findings which may represent a large uterine fibroid. Further evaluation with nonemergent MRI is recommended to exclude the presence of an underlying neoplastic process. 2. 13 cm diameter simple left renal cyst. No follow-up imaging is recommended. This recommendation follows ACR consensus guidelines: Management of the Incidental Renal Mass on CT: A White Paper of the ACR Incidental Findings Committee. J Am Coll Radiol [id number]. Electronically Signed   By: Aram Candela M.D.   On: 12/23/2022 04:29    CT ABDOMEN PELVIS W CONTRAST  Result Date: 12/23/2022 CLINICAL DATA:  Left lower quadrant pain. EXAM: CT ABDOMEN AND PELVIS WITH CONTRAST TECHNIQUE: Multidetector CT imaging of the abdomen and pelvis was performed using the standard protocol following bolus administration of intravenous contrast. RADIATION DOSE REDUCTION: This exam was performed according to the departmental dose-optimization program which includes automated exposure control, adjustment of the mA and/or kV according to patient size and/or use of iterative reconstruction technique. CONTRAST:  OMNIPAQUE IOHEXOL 300 MG/ML  SOLN COMPARISON:  None Available. FINDINGS: Lower chest: No acute abnormality. Hepatobiliary: No focal liver abnormality is seen. No gallstones, gallbladder wall thickening, or biliary dilatation. Pancreas: Unremarkable. No pancreatic ductal dilatation or  surrounding inflammatory changes. Spleen: Normal in size without focal abnormality. Adrenals/Urinary Tract: Adrenal glands are unremarkable. Kidneys are normal in size, without renal calculi or hydronephrosis. A 13 cm diameter simple cyst  is seen within the posterior aspect of the upper pole of the left kidney. Bladder is unremarkable. Stomach/Bowel: Stomach is within normal limits. Appendix appears normal. No evidence of bowel wall thickening, distention, or inflammatory changes. Vascular/Lymphatic: No significant vascular findings are present. No enlarged abdominal or pelvic lymph nodes. Reproductive: A 5.5 cm x 5.2 cm x 6.8 cm heterogeneous, partially calcified, enhancing soft tissue mass is seen within the anterior aspect of the uterus. This extends into the endometrium. The bilateral adnexa are unremarkable. Other: No abdominal wall hernia or abnormality. No abdominopelvic ascites. Musculoskeletal: No acute or significant osseous findings. IMPRESSION: 1. Findings which may represent a large uterine fibroid. Further evaluation with nonemergent MRI is recommended to exclude the presence of an underlying neoplastic process. 2. 13 cm diameter simple left renal cyst. No follow-up imaging is recommended. This recommendation follows ACR consensus guidelines: Management of the Incidental Renal Mass on CT: A White Paper of the ACR Incidental Findings Committee. J Am Coll Radiol [phone]. Electronically Signed   By: Aram Candela M.D.   On: 12/23/2022 04:29    Procedures Procedures    Medications Ordered in ED Medications  ketorolac (TORADOL) 30 MG/ML injection 30 mg (has no administration in time range)  tranexamic acid (CYKLOKAPRON) IVPB 1,000 mg (has no administration in time range)  sodium chloride 0.9 % bolus 1,000 mL (1,000 mLs Intravenous New Bag/Given 12/23/22 0154)  iohexol (OMNIPAQUE) 300 MG/ML solution 100 mL (100 mLs Intravenous Contrast Given 12/23/22 0359)    ED Course/ Medical  Decision Making/ A&P                             Medical Decision Making Patient with continued pain and vaginal clots post procedure Thursday.   Amount and/or Complexity of Data Reviewed Independent Historian: spouse    Details: See above  External Data Reviewed: labs and notes.    Details: Pre op CBC and op notes reviewed  Labs: ordered.    Details: Lipase normal 12, negative pregnancy test.  No UTI on urien.  Normal white count, hemglobin low 9.3 down from 10.1 normal platelet count.  Normal sodium 136, normal potassium 3.8, normal creatinine and LFTs  Radiology: ordered and independent interpretation performed.    Details: No free air on CT by me  Discussion of management or test interpretation with external provider(s): Spoke to Dr. Timothy Lasso on call for Dr. Mindi Slicker. IV TXA one dose and one dose of toradol.  May add ibuprofen to percocet and office will call the patient for close follow up.    Risk Prescription drug management. Risk Details: Given pain medication and fluids and TXA and zofran.  Have added ibuprofen.  Well appearing, stable for discharge.  To be called by Dr. Phineas Real office for close follow up.  Strict return.      Final Clinical Impression(s) / ED Diagnoses Final diagnoses:  Fibroid   Return for intractable cough, coughing up blood, fevers > 100.4 unrelieved by medication, shortness of breath, intractable vomiting, chest pain, shortness of breath, weakness, numbness, changes in speech, facial asymmetry, abdominal pain, passing out, Inability to tolerate liquids or food, cough, altered mental status or any concerns. No signs of systemic illness or infection. The patient is nontoxic-appearing on exam and vital signs are within normal limits.  I have reviewed the triage vital signs and the nursing notes. Pertinent labs & imaging results that were available during my care of the patient were reviewed by  me and considered in my medical decision making (see chart for  details). After history, exam, and medical workup I feel the patient has been appropriately medically screened and is safe for discharge home. Pertinent diagnoses were discussed with the patient. Patient was given return precautions Rx / DC Orders ED Discharge Orders          Ordered    ibuprofen (ADVIL) 800 MG tablet  3 times daily        12/23/22 0442              Asiya Cutbirth, MD 12/23/22 5366

## 2022-12-24 ENCOUNTER — Encounter (HOSPITAL_BASED_OUTPATIENT_CLINIC_OR_DEPARTMENT_OTHER): Payer: Self-pay | Admitting: Obstetrics and Gynecology

## 2023-02-11 ENCOUNTER — Inpatient Hospital Stay: Admit: 2023-02-11 | Payer: Medicaid Other | Admitting: Obstetrics and Gynecology

## 2023-02-11 SURGERY — MYOMECTOMY, ABDOMINAL APPROACH
Anesthesia: General

## 2023-02-25 ENCOUNTER — Encounter (HOSPITAL_BASED_OUTPATIENT_CLINIC_OR_DEPARTMENT_OTHER): Payer: Self-pay

## 2023-02-25 ENCOUNTER — Emergency Department (HOSPITAL_BASED_OUTPATIENT_CLINIC_OR_DEPARTMENT_OTHER)
Admission: EM | Admit: 2023-02-25 | Discharge: 2023-02-26 | Disposition: A | Discharge status: Home / Self Care | Payer: Medicaid Other | Attending: Emergency Medicine | Admitting: Emergency Medicine

## 2023-02-25 ENCOUNTER — Emergency Department (HOSPITAL_BASED_OUTPATIENT_CLINIC_OR_DEPARTMENT_OTHER): Payer: Medicaid Other | Admitting: Radiology

## 2023-02-25 ENCOUNTER — Other Ambulatory Visit: Payer: Self-pay

## 2023-02-25 DIAGNOSIS — R1310 Dysphagia, unspecified: Secondary | ICD-10-CM | POA: Insufficient documentation

## 2023-02-25 NOTE — ED Triage Notes (Addendum)
Patient here POV from Home.  Endorses having Tooth Extracted 5 Days ago and since then has had Sore Throat/Dysphagia. Worsened since then gradually. Speaking in normal/complete sentences. No Oral/Airway Obstruction at the time of triage.  NAD Noted during Triage. A&Ox4. GCS 15. Ambulatory.

## 2023-02-26 ENCOUNTER — Ambulatory Visit: Payer: Medicaid Other | Admitting: Podiatry

## 2023-02-26 MED ORDER — LIDOCAINE VISCOUS HCL 2 % MT SOLN: 15.0000 mL | Freq: Four times a day (QID) | OROMUCOSAL | 0 refills | Status: AC | PRN

## 2023-02-26 MED ORDER — LIDOCAINE VISCOUS HCL 2 % MT SOLN
15.0000 mL | Freq: Once | OROMUCOSAL | Status: AC
  Administered 2023-02-26: 15 mL via OROMUCOSAL
  Filled 2023-02-26: qty 15

## 2023-02-26 NOTE — ED Provider Notes (Signed)
[place] EMERGENCY DEPARTMENT AT [facility]  Provider Note  CSN: [id number] Arrival date & time: 02/25/23 2035  History Chief Complaint  Patient presents with   Dysphagia    Renee Roy is a 47 y.o. female with no significant PMH reports she had all four wisdom teeth removed about 5 days ago. She states she had it done at a surgery center, unsure if she had general anesthesia and ETT or not. She reports since the surgery she has had sore throat, difficulty swallowing food, able to drink liquids and swallow soft foods such as yoghurt. She called her surgeon's office today but he is out of town. She reports something similar happened last year after she had surgery for uterine fibroids, symptoms lasted a month that time. She denies any fever, post-op pain is well controlled.    Home Medications Prior to Admission medications   Medication Sig Start Date End Date Taking? Authorizing Provider  lidocaine (XYLOCAINE) 2 % solution Use as directed 15 mLs in the mouth or throat every 6 (six) hours as needed for mouth pain. 02/26/23  Yes Pollyann Savoy, MD  acetaminophen (TYLENOL) 325 MG tablet Take 3 tablets (975 mg total) by mouth every 6 (six) hours as needed. 11/13/21   Huel Cote, MD  ibuprofen (ADVIL) 600 MG tablet Take 1 tablet (600 mg total) by mouth every 6 (six) hours as needed for moderate pain or cramping. 12/20/22   Banga, Sharol Given, DO  ibuprofen (ADVIL) 800 MG tablet Take 1 tablet (800 mg total) by mouth 3 (three) times daily. 12/23/22   Palumbo, April, MD  Iron-FA-B Cmp-C-Biot-Probiotic (FUSION PLUS) CAPS Take 1 capsule by mouth daily.    [provider]  latanoprost (XALATAN) 0.005 % ophthalmic solution Place 1 drop into both eyes at bedtime.    [provider]  montelukast (SINGULAIR) 10 MG tablet Take 10 mg by mouth as needed.    [provider]  naproxen (NAPROSYN) 375 MG tablet Take 1 tablet (375 mg total) by mouth 2 (two) times  daily. 03/18/21   Wallis Bamberg, PA-C  norgestimate-ethinyl estradiol (ESTARYLLA) 0.25-35 MG-MCG tablet Take 1 active tablet 3 times daily for 3 days or until bleeding stops.  Then take 1 active tab twice daily for 3 to 5 days.  Then take 1 active tab daily until all active tabs are completed. Patient taking differently: at bedtime. Take 1 active tablet 3 times daily for 3 days or until bleeding stops.  Then take 1 active tab twice daily for 3 to 5 days.  Then take 1 active tab daily until all active tabs are completed. 11/06/22   Molpus, John, MD  olopatadine (PATADAY) 0.1 % ophthalmic solution Place 1 drop into both eyes as needed for allergies.    [provider]  olopatadine (PATANOL) 0.1 % ophthalmic solution Place 2 drops into both eyes 2 (two) times daily.    [provider]  oseltamivir (TAMIFLU) 75 MG capsule Take 1 capsule (75 mg total) by mouth every 12 (twelve) hours. Patient not taking: Reported on 12/20/2022 07/31/22   Marita Kansas, PA-C  sodium chloride (OCEAN) 0.65 % SOLN nasal spray Place 1 spray into both nostrils 2 (two) times daily. Olpataday nasal spray    [provider]     Allergies    Patient has no known allergies.   Review of Systems   Review of Systems Please see HPI for pertinent positives and negatives  Physical Exam BP (!) 140/101 (BP Location:  Right Arm)   Pulse 78   Temp (!) 97.4 F (36.3 C) (Temporal)   Resp 18   Ht 5\' 3"  (1.6 m)   Wt 59 kg   SpO2 99%   BMI 23.03 kg/m   Physical Exam Vitals and nursing note reviewed.  HENT:     Head: Normocephalic.     Nose: Nose normal.     Mouth/Throat:     Mouth: Mucous membranes are moist.     Pharynx: No oropharyngeal exudate.     Comments: Post op changes of all four wisdom teeth, some bruising to L soft palate, but no swelling or signs of obstruction Eyes:     Extraocular Movements: Extraocular movements intact.  Neck:     Comments: No soft tissue swelling of neck Pulmonary:      Effort: Pulmonary effort is normal.     Breath sounds: No stridor.  Musculoskeletal:        General: Normal range of motion.     Cervical back: Neck supple. No tenderness.  Skin:    Findings: No rash (on exposed skin).  Neurological:     Mental Status: She is alert and oriented to person, place, and time.  Psychiatric:        Mood and Affect: Mood normal.     ED Results / Procedures / Treatments   EKG None  Procedures Procedures  Medications Ordered in the ED Medications  lidocaine (XYLOCAINE) 2 % viscous mouth solution 15 mL (has no administration in time range)    Initial Impression and Plan  Patient here with post-op sore throat/dysphagia. Does not appear to by any airway involvement. Likely from presumed airway device during her procedure. I personally viewed the images from radiology studies and agree with radiologist interpretation: Xray neg for signs of soft tissue swelling or infection.  Will give viscous lidocaine for comfort. Recommend she continue with soft foods. ENT follow up for laryngoscopy if not improving.   ED Course       MDM Rules/Calculators/A&P Medical Decision Making Problems Addressed: Dysphagia, unspecified type: acute illness or injury  Amount and/or Complexity of Data Reviewed Radiology: ordered and independent interpretation performed. Decision-making details documented in ED Course.  Risk Prescription drug management.     Final Clinical Impression(s) / ED Diagnoses Final diagnoses:  Dysphagia, unspecified type    Rx / DC Orders ED Discharge Orders          Ordered    lidocaine (XYLOCAINE) 2 % solution  Every 6 hours PRN        02/26/23 0024             Pollyann Savoy, MD 02/26/23 0025

## 2023-03-17 IMAGING — CR DG KNEE COMPLETE 4+V*R*
4 series · 4 of 4 positions shown · non-contrast
Comparison: 06/10/2017.

CLINICAL DATA: Knee pain and swelling.

EXAM:
RIGHT KNEE - COMPLETE 4+ VIEW

[knee ap]
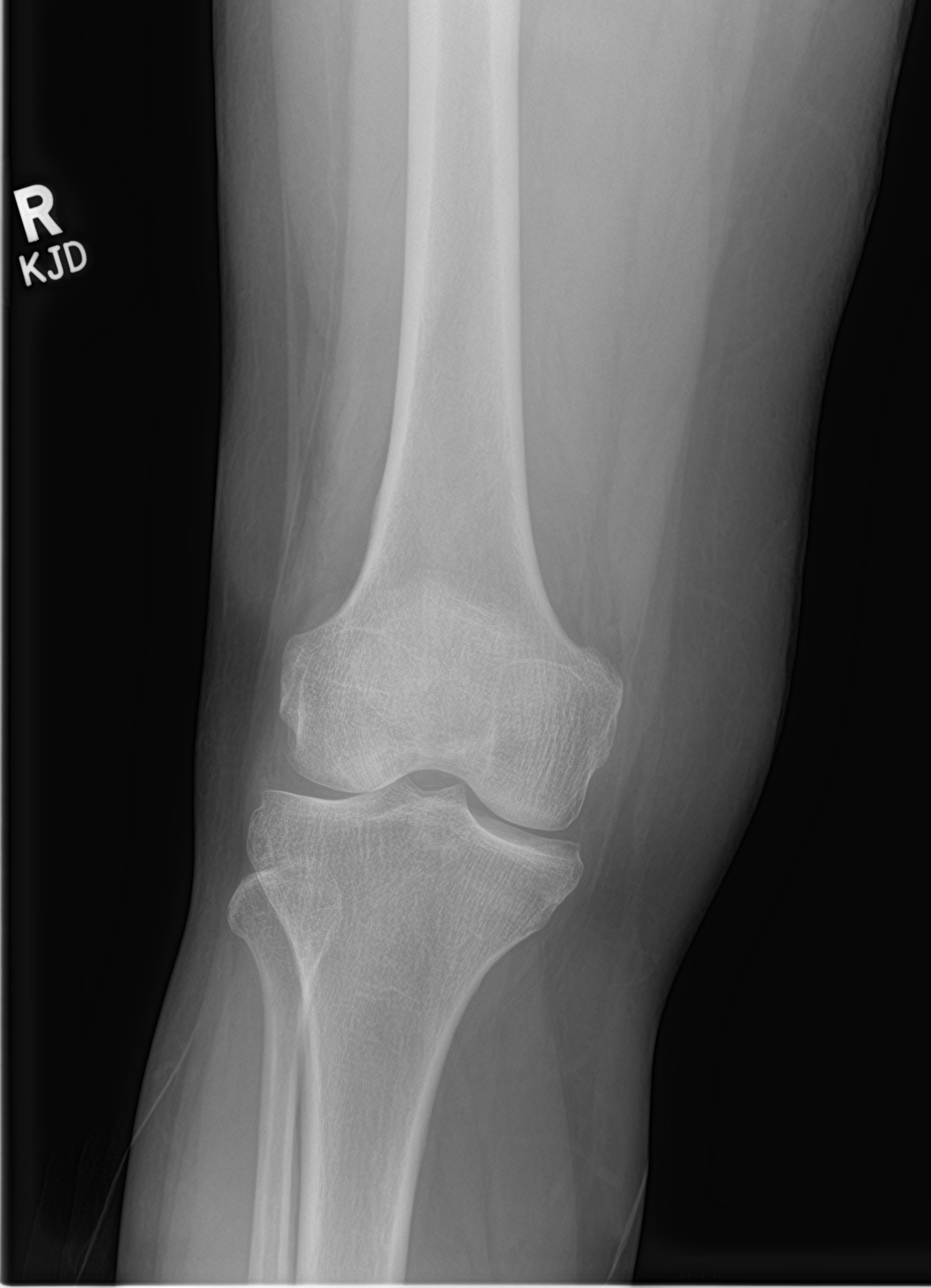

[knee lat]
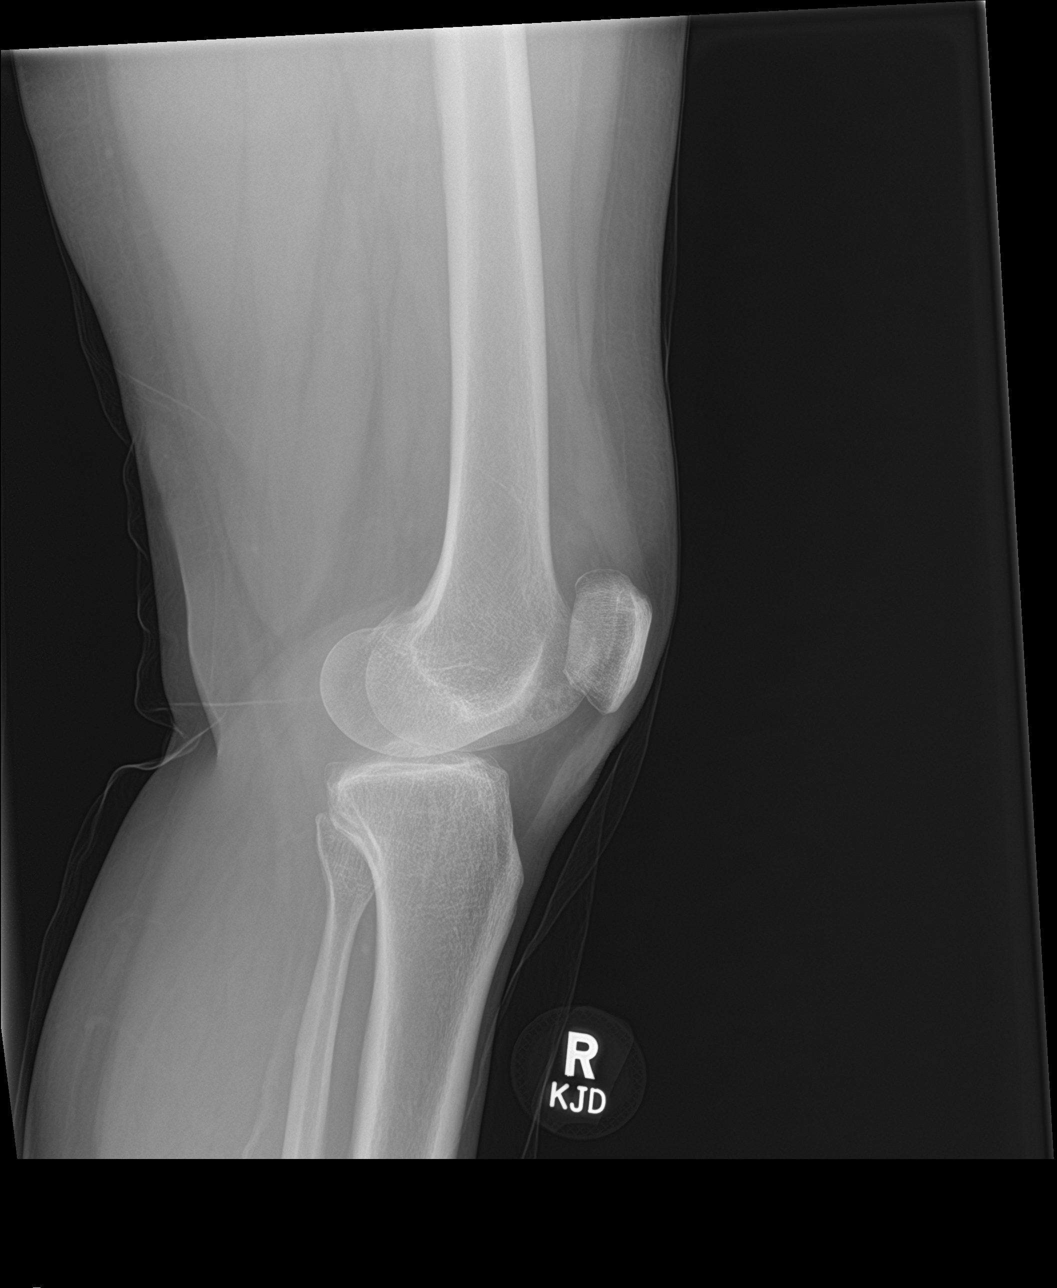

[knee obl (1 of 2)]
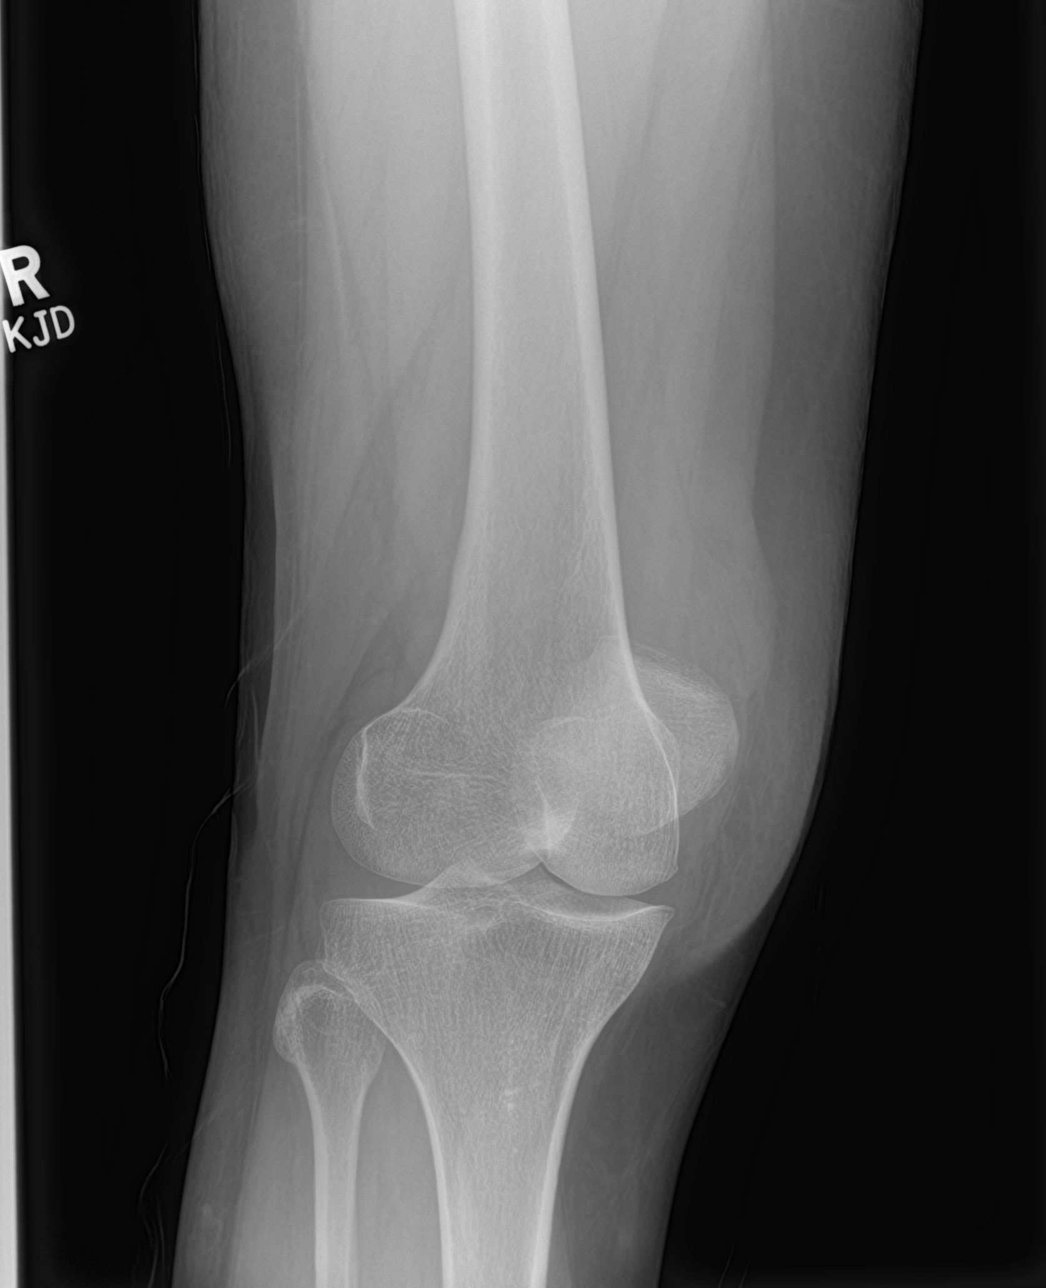

[knee obl (2 of 2)]
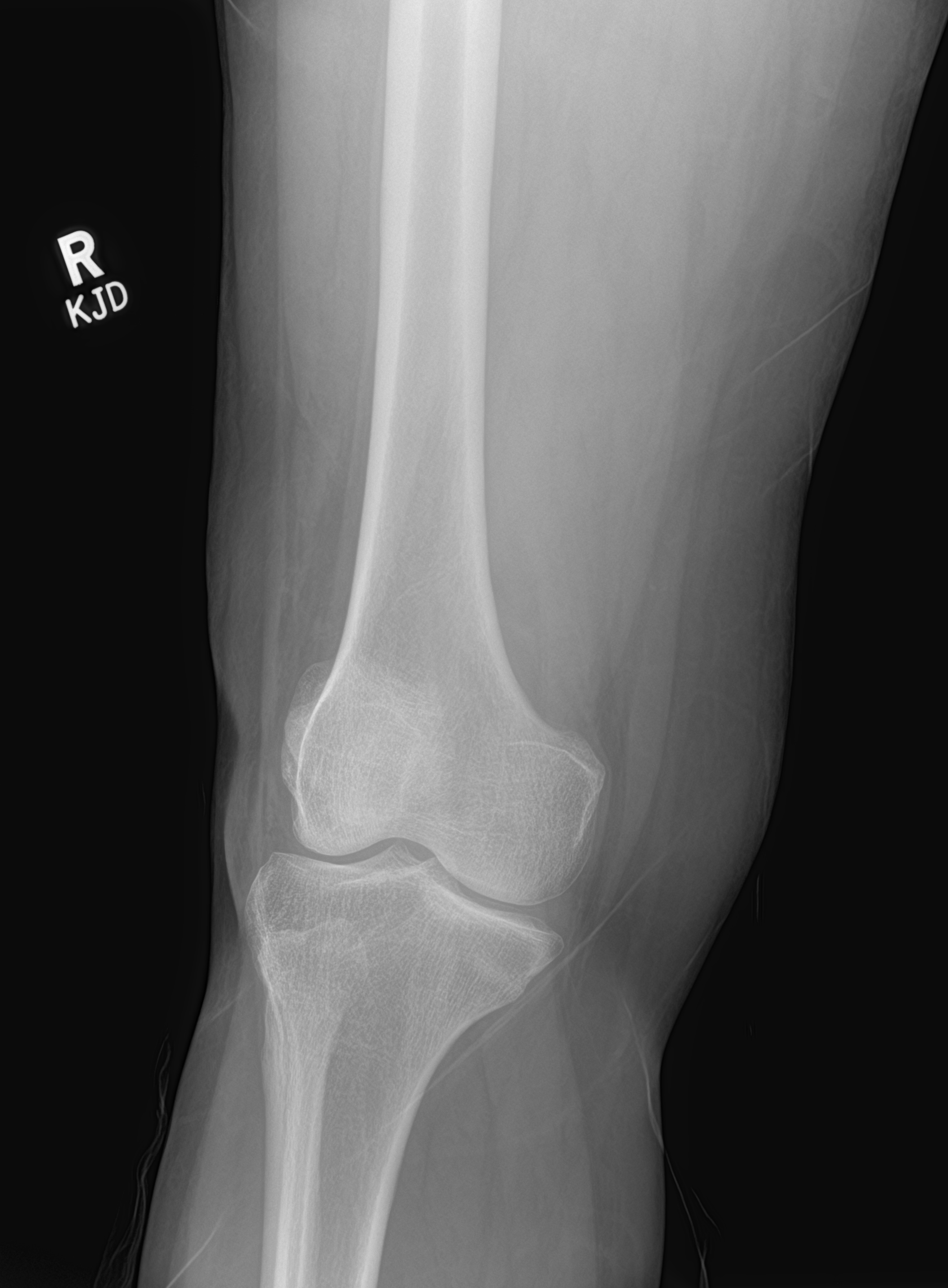

[4 of 4 positions shown; findings below may reference images not displayed]

FINDINGS: No acute fracture or dislocation. Joint space is maintained. There
is a trace suprapatellar joint effusion. The soft tissues are
otherwise within normal limits.
IMPRESSION: 1. No acute fracture or dislocation.
2. Trace suprapatellar joint effusion.

## 2023-03-25 ENCOUNTER — Inpatient Hospital Stay: Admit: 2023-03-25 | Payer: Medicaid Other | Admitting: Obstetrics and Gynecology

## 2023-03-25 SURGERY — MYOMECTOMY, ABDOMINAL APPROACH
Anesthesia: General
